# Patient Record
Sex: Female | Born: 1980 | Race: Black or African American | Hispanic: No | Smoking: Current every day smoker
Health system: Southern US, Community
[De-identification: ages and names within clinical notes are randomized; demographics above are authoritative.]

## PROBLEM LIST (undated history)

## (undated) DIAGNOSIS — E876 Hypokalemia: Secondary | ICD-10-CM

## (undated) HISTORY — PX: TUBAL LIGATION: SHX77

---
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
## 2004-04-13 ENCOUNTER — Emergency Department: Payer: Self-pay | Admitting: Emergency Medicine

## 2004-07-16 ENCOUNTER — Emergency Department: Payer: Self-pay | Admitting: Internal Medicine

## 2004-07-17 ENCOUNTER — Ambulatory Visit: Payer: Self-pay | Admitting: Internal Medicine

## 2005-10-31 ENCOUNTER — Emergency Department: Payer: Self-pay | Admitting: Emergency Medicine

## 2006-07-11 ENCOUNTER — Emergency Department: Payer: Self-pay | Admitting: Emergency Medicine

## 2006-10-31 ENCOUNTER — Inpatient Hospital Stay: Payer: Self-pay | Admitting: Internal Medicine

## 2007-10-31 ENCOUNTER — Emergency Department: Payer: Self-pay | Admitting: Emergency Medicine

## 2007-12-09 ENCOUNTER — Emergency Department: Payer: Self-pay | Admitting: Emergency Medicine

## 2008-07-01 ENCOUNTER — Emergency Department: Payer: Self-pay | Admitting: Internal Medicine

## 2008-10-04 ENCOUNTER — Emergency Department: Payer: Self-pay | Admitting: Emergency Medicine

## 2008-12-03 ENCOUNTER — Emergency Department: Payer: Self-pay | Admitting: Emergency Medicine

## 2009-05-03 ENCOUNTER — Observation Stay: Payer: Self-pay | Admitting: Obstetrics and Gynecology

## 2009-06-13 ENCOUNTER — Inpatient Hospital Stay: Payer: Self-pay | Admitting: Obstetrics and Gynecology

## 2010-04-12 ENCOUNTER — Inpatient Hospital Stay: Payer: Self-pay | Admitting: Internal Medicine

## 2010-12-11 ENCOUNTER — Emergency Department: Payer: Self-pay | Admitting: Emergency Medicine

## 2010-12-12 ENCOUNTER — Inpatient Hospital Stay (HOSPITAL_COMMUNITY)
Admission: EM | Admit: 2010-12-12 | Discharge: 2010-12-17 | DRG: 392 | Disposition: A | Discharge status: Home / Self Care | Payer: Medicaid Other | Attending: Internal Medicine | Admitting: Internal Medicine

## 2010-12-12 ENCOUNTER — Emergency Department: Payer: Self-pay | Admitting: Emergency Medicine

## 2010-12-12 DIAGNOSIS — R112 Nausea with vomiting, unspecified: Secondary | ICD-10-CM | POA: Diagnosis present

## 2010-12-12 DIAGNOSIS — R1115 Cyclical vomiting syndrome unrelated to migraine: Secondary | ICD-10-CM

## 2010-12-12 DIAGNOSIS — E860 Dehydration: Secondary | ICD-10-CM

## 2010-12-12 DIAGNOSIS — E876 Hypokalemia: Secondary | ICD-10-CM | POA: Diagnosis present

## 2010-12-12 DIAGNOSIS — Z79899 Other long term (current) drug therapy: Secondary | ICD-10-CM

## 2010-12-12 DIAGNOSIS — R109 Unspecified abdominal pain: Principal | ICD-10-CM | POA: Diagnosis present

## 2010-12-12 DIAGNOSIS — K5900 Constipation, unspecified: Secondary | ICD-10-CM | POA: Diagnosis present

## 2010-12-12 DIAGNOSIS — F17200 Nicotine dependence, unspecified, uncomplicated: Secondary | ICD-10-CM | POA: Diagnosis present

## 2010-12-12 LAB — CBC
Hemoglobin: 13.7 g/dL (ref 12.0–15.0)
Platelets: 193 10*3/uL (ref 150–400)
RBC: 4.42 MIL/uL (ref 3.87–5.11)
WBC: 6.8 10*3/uL (ref 4.0–10.5)

## 2010-12-12 LAB — URINALYSIS, ROUTINE W REFLEX MICROSCOPIC
Glucose, UA: NEGATIVE mg/dL
Ketones, ur: 40 mg/dL — AB
Leukocytes, UA: NEGATIVE
Protein, ur: 100 mg/dL — AB
pH: 7 (ref 5.0–8.0)

## 2010-12-12 LAB — COMPREHENSIVE METABOLIC PANEL
ALT: 12 U/L (ref 0–35)
Albumin: 4 g/dL (ref 3.5–5.2)
Alkaline Phosphatase: 40 U/L (ref 39–117)
BUN: 13 mg/dL (ref 6–23)
Chloride: 100 mEq/L (ref 96–112)
GFR calc Af Amer: >90 mL/min (ref >90)
Glucose, Bld: 94 mg/dL (ref 70–99)
Potassium: 2.9 mEq/L — ABNORMAL LOW (ref 3.5–5.1)
Sodium: 141 mEq/L (ref 135–145)
Total Bilirubin: 0.3 mg/dL (ref 0.3–1.2)

## 2010-12-12 LAB — DIFFERENTIAL
Lymphs Abs: 0.9 10*3/uL (ref 0.7–4.0)
Monocytes Relative: 11 % (ref 3–12)
Neutro Abs: 5.2 10*3/uL (ref 1.7–7.7)
Neutrophils Relative %: 77 % (ref 43–77)

## 2010-12-12 LAB — URINE MICROSCOPIC-ADD ON

## 2010-12-12 MED ORDER — METOCLOPRAMIDE HCL 5 MG/ML IJ SOLN
10.0000 mg | Freq: Once | INTRAMUSCULAR | Status: AC
  Administered 2010-12-12: 10 mg via INTRAVENOUS
  Filled 2010-12-12: qty 2

## 2010-12-12 MED ORDER — POTASSIUM CHLORIDE 10 MEQ/100ML IV SOLN
10.0000 meq | Freq: Once | INTRAVENOUS | Status: AC
  Administered 2010-12-12: 10 meq via INTRAVENOUS
  Filled 2010-12-12: qty 100

## 2010-12-12 MED ORDER — SODIUM CHLORIDE 0.9 % IV BOLUS (SEPSIS)
1000.0000 mL | Freq: Once | INTRAVENOUS | Status: AC
  Administered 2010-12-12: 1000 mL via INTRAVENOUS

## 2010-12-12 MED ORDER — MORPHINE SULFATE 2 MG/ML IJ SOLN
2.0000 mg | INTRAMUSCULAR | Status: DC | PRN
  Administered 2010-12-12: 2 mg via INTRAVENOUS
  Filled 2010-12-12: qty 1

## 2010-12-12 MED ORDER — ONDANSETRON HCL 4 MG/2ML IJ SOLN
4.0000 mg | Freq: Once | INTRAMUSCULAR | Status: AC
  Administered 2010-12-12: 4 mg via INTRAVENOUS
  Filled 2010-12-12: qty 2

## 2010-12-12 MED ORDER — ONDANSETRON HCL 4 MG/2ML IJ SOLN
4.0000 mg | Freq: Four times a day (QID) | INTRAMUSCULAR | Status: DC | PRN
  Administered 2010-12-12 – 2010-12-13 (×3): 4 mg via INTRAVENOUS
  Filled 2010-12-12 (×3): qty 2

## 2010-12-12 MED ORDER — MORPHINE SULFATE 4 MG/ML IJ SOLN
INTRAMUSCULAR | Status: AC
  Administered 2010-12-12: 2 mg
  Filled 2010-12-12: qty 1

## 2010-12-12 MED ORDER — MORPHINE SULFATE 4 MG/ML IJ SOLN
4.0000 mg | Freq: Once | INTRAMUSCULAR | Status: AC
  Administered 2010-12-12: 4 mg via INTRAVENOUS
  Filled 2010-12-12: qty 1

## 2010-12-12 MED ORDER — SODIUM CHLORIDE 0.9 % IV SOLN
INTRAVENOUS | Status: DC
  Administered 2010-12-12: 18:00:00 via INTRAVENOUS
  Administered 2010-12-13: 1000 mL via INTRAVENOUS
  Administered 2010-12-13: 08:00:00 via INTRAVENOUS

## 2010-12-12 MED ORDER — SODIUM CHLORIDE 0.9 % IV SOLN: Freq: Once | INTRAVENOUS | Status: DC

## 2010-12-12 NOTE — ED Notes (Signed)
Provider aware of ineffectiveness of prn doses of zofran and morphine

## 2010-12-12 NOTE — ED Provider Notes (Signed)
History     CSN: [id number] Arrival date & time: 12/12/2010  1:31 PM   First MD Initiated Contact with Patient 12/12/10 1347      Chief Complaint  Patient presents with  . Abdominal Pain    (Consider location/radiation/quality/duration/timing/severity/associated sxs/prior treatment) Patient is a 30 y.o. female presenting with abdominal pain. The history is provided by the patient.  Abdominal Pain The primary symptoms of the illness include abdominal pain, nausea, vomiting and diarrhea. The primary symptoms of the illness do not include fever, dysuria, vaginal discharge or vaginal bleeding. The current episode started more than 2 days ago. The onset of the illness was sudden. The problem has not changed since onset. The abdominal pain is generalized (started in the right pelvic region but has now spread to the entire abd.). The severity of the abdominal pain is 6/10. The abdominal pain is relieved by nothing. The abdominal pain is exacerbated by vomiting and eating.  Vomiting occurs more than 10 times per day. The emesis contains stomach contents.  The patient states that she believes she is currently not pregnant. Symptoms associated with the illness do not include constipation.    History reviewed. No pertinent past medical history.  History reviewed. No pertinent past surgical history.  No family history on file.  History  Substance Use Topics  . Smoking status: Not on file  . Smokeless tobacco: Not on file  . Alcohol Use: Not on file    OB History    Grav Para Term Preterm Abortions TAB SAB Ect Mult Living                  Review of Systems  Constitutional: Negative for fever.  Gastrointestinal: Positive for nausea, vomiting, abdominal pain and diarrhea. Negative for constipation.  Genitourinary: Negative for dysuria, vaginal bleeding and vaginal discharge.  All other systems reviewed and are negative.    Allergies  Review of patient's allergies indicates no  known allergies.  Home Medications   Current Outpatient Rx  Name Route Sig Dispense Refill  . HYDROCODONE-ACETAMINOPHEN 5-325 MG PO TABS Oral Take 1 tablet by mouth every 6 (six) hours as needed. FOR PAIN    . ONDANSETRON HCL 4 MG PO TABS Oral Take 4 mg by mouth every 6 (six) hours as needed. FOR NAUSEA & VOMITING       Pulse 62  Temp(Src) 98.4 F (36.9 C) (Oral)  Resp 22  Ht 5\' 11"  (1.803 m)  Wt 180 lb (81.647 kg)  BMI 25.10 kg/m2  SpO2 100%  LMP 11/10/2010  Physical Exam  Nursing note and vitals reviewed. Constitutional: She is oriented to person, place, and time. She appears well-developed and well-nourished. She appears distressed.  HENT:  Head: Normocephalic and atraumatic.  Mouth/Throat: Mucous membranes are dry.  Eyes: EOM are normal. Pupils are equal, round, and reactive to light.  Cardiovascular: Normal rate, regular rhythm, normal heart sounds and intact distal pulses.  Exam reveals no friction rub.   No murmur heard. Pulmonary/Chest: Effort normal and breath sounds normal. She has no wheezes. She has no rales.  Abdominal: Soft. Bowel sounds are normal. She exhibits no distension. There is generalized tenderness. There is no rebound, no guarding and no CVA tenderness.  Musculoskeletal: Normal range of motion. She exhibits no tenderness.       No edema  Neurological: She is alert and oriented to person, place, and time. No cranial nerve deficit.  Skin: Skin is warm and dry. No rash noted.  Psychiatric: She  has a normal mood and affect. Her behavior is normal.    ED Course  Procedures (including critical care time)   Labs Reviewed  CBC  DIFFERENTIAL  COMPREHENSIVE METABOLIC PANEL  URINALYSIS, ROUTINE W REFLEX MICROSCOPIC   No results found.   No diagnosis found.    MDM   Pt with persistent vomiting since tues and some mild diarrhea which is now resolved but mild diffuse abd pain due to all the vomitting but no peritoneal signs.  Pt was seen at [facility]  last night and given fluids and antiemetic and states had neg pregnancy, and neg urine and blood tests.  States had an U/S of her entire abd which was normal.  States sometimes this happens before her period. Will get CBC, CMP and will hydrate and give antiemetic.  Will place in obs.      Gwyneth Sprout, MD 12/12/10 1958

## 2010-12-12 NOTE — ED Notes (Signed)
Pt stating now pain and nausea subsided.

## 2010-12-12 NOTE — ED Notes (Signed)
Denies nausea, ice chips given

## 2010-12-12 NOTE — ED Notes (Signed)
Pt presents with 3 day h/o lower abdominal pain that is now generalized with nausea, vomiting and diarrhea.  Pt seen at [facility], received IV fluids with blood and urine tests negative; ultrasound: negative.  Pt reports she was supposed to start her period on the 14th, (pregnancy test: negative).  Pt reports pain has worsened, unable to keep anything down (eating ice chips, then vomiting in triage).

## 2010-12-12 NOTE — ED Notes (Signed)
Pt to cdu on dehydration protocol. States lower quad abd pain since Tuesday that has now become generalized. Also assoc with n/v/d. Pt was seen at [facility] today and Tuesday. States screening there was negative.

## 2010-12-12 NOTE — ED Notes (Signed)
Attempt two IV attempts paged and spoke with IV team will attempt IV.

## 2010-12-12 NOTE — ED Notes (Signed)
Pt given ginger ale. Pt drink entire can of such, pt now reports pain and nausea.

## 2010-12-12 NOTE — ED Notes (Signed)
Pt vomited after ice chips given. Reports increased pain.

## 2010-12-13 ENCOUNTER — Encounter (HOSPITAL_COMMUNITY): Payer: Self-pay | Admitting: General Practice

## 2010-12-13 ENCOUNTER — Emergency Department (HOSPITAL_COMMUNITY): Payer: Medicaid Other

## 2010-12-13 LAB — COMPREHENSIVE METABOLIC PANEL
ALT: 10 U/L (ref 0–35)
AST: 16 U/L (ref 0–37)
Albumin: 3.5 g/dL (ref 3.5–5.2)
Alkaline Phosphatase: 38 U/L — ABNORMAL LOW (ref 39–117)
Glucose, Bld: 87 mg/dL (ref 70–99)
Potassium: 3 mEq/L — ABNORMAL LOW (ref 3.5–5.1)
Sodium: 139 mEq/L (ref 135–145)
Total Protein: 6.9 g/dL (ref 6.0–8.3)

## 2010-12-13 LAB — CBC
HCT: 38 % (ref 36.0–46.0)
MCH: 30.5 pg (ref 26.0–34.0)
MCV: 89.8 fL (ref 78.0–100.0)
Platelets: 187 10*3/uL (ref 150–400)
RBC: 4.23 MIL/uL (ref 3.87–5.11)

## 2010-12-13 MED ORDER — POTASSIUM CHLORIDE 10 MEQ/100ML IV SOLN
10.0000 meq | INTRAVENOUS | Status: AC
  Administered 2010-12-13 (×5): 10 meq via INTRAVENOUS
  Filled 2010-12-13 (×5): qty 100

## 2010-12-13 MED ORDER — PANTOPRAZOLE SODIUM 40 MG IV SOLR
40.0000 mg | INTRAVENOUS | Status: DC
  Administered 2010-12-13 – 2010-12-16 (×4): 40 mg via INTRAVENOUS
  Filled 2010-12-13 (×5): qty 40

## 2010-12-13 MED ORDER — PROMETHAZINE HCL 25 MG/ML IJ SOLN
12.5000 mg | INTRAMUSCULAR | Status: AC
  Filled 2010-12-13: qty 1

## 2010-12-13 MED ORDER — METOCLOPRAMIDE HCL 5 MG/ML IJ SOLN
10.0000 mg | Freq: Once | INTRAMUSCULAR | Status: AC
  Administered 2010-12-13: 10 mg via INTRAVENOUS
  Filled 2010-12-13: qty 2

## 2010-12-13 MED ORDER — MORPHINE SULFATE 2 MG/ML IJ SOLN: 1.0000 mg | INTRAMUSCULAR | Status: DC | PRN

## 2010-12-13 MED ORDER — SODIUM CHLORIDE 0.9 % IV SOLN
INTRAVENOUS | Status: DC
  Administered 2010-12-13 – 2010-12-16 (×6): via INTRAVENOUS
  Administered 2010-12-16: 150 mL/h via INTRAVENOUS
  Administered 2010-12-16: 01:00:00 via INTRAVENOUS

## 2010-12-13 MED ORDER — LORAZEPAM 2 MG/ML IJ SOLN
1.0000 mg | Freq: Once | INTRAMUSCULAR | Status: AC
  Administered 2010-12-13: 1 mg via INTRAVENOUS
  Filled 2010-12-13: qty 1

## 2010-12-13 MED ORDER — PROMETHAZINE HCL 25 MG/ML IJ SOLN
12.5000 mg | Freq: Four times a day (QID) | INTRAMUSCULAR | Status: DC | PRN
  Administered 2010-12-13 (×2): 12.5 mg via INTRAVENOUS
  Filled 2010-12-13 (×2): qty 1

## 2010-12-13 MED ORDER — FLEET ENEMA 7-19 GM/118ML RE ENEM
1.0000 | ENEMA | Freq: Every day | RECTAL | Status: DC | PRN
  Filled 2010-12-13: qty 1

## 2010-12-13 MED ORDER — ENOXAPARIN SODIUM 40 MG/0.4ML ~~LOC~~ SOLN
40.0000 mg | SUBCUTANEOUS | Status: DC
  Administered 2010-12-13 – 2010-12-16 (×4): 40 mg via SUBCUTANEOUS
  Filled 2010-12-13 (×5): qty 0.4

## 2010-12-13 MED ORDER — PROMETHAZINE HCL 25 MG PO TABS
12.5000 mg | ORAL_TABLET | Freq: Four times a day (QID) | ORAL | Status: DC | PRN
  Filled 2010-12-13: qty 1

## 2010-12-13 NOTE — ED Notes (Signed)
Clear liquid breakfast ordered

## 2010-12-13 NOTE — ED Provider Notes (Signed)
Medical screening examination/treatment/procedure(s) were performed by non-physician practitioner and as supervising physician I was immediately available for consultation/collaboration.   Laray Anger, DO 12/13/10 [phone]

## 2010-12-13 NOTE — H&P (Signed)
Hospital Admission Note Date: 12/13/2010  Patient name: Erin Pitts Medical record number: [id number] Date of birth: 08-24-1980 Age: 30 y.o. Gender: female PCP: No primary provider on file.  Medical Service: Internal medicine teaching service - Maurice March  Attending physician:  Dr. Meredith Pel    1st Contact: Dr. Richarda Blade    Pager: [phone] 2nd Contact: Dr. Tonny Branch    Pager: [phone] After 5 pm or weekends: 1st Contact:    Pager: [phone] 2nd Contact:    Pager: [phone]  Chief Complaint: nausea, vomiting, lower abdominal discomfort  History of Present Illness:  Patient is a 30 year old female with no known past medical history who presents with 4 days of vomiting and lower abdominal pain. She states she initially began to experience some lower abdominal pain that was intermittent and cramping in nature and very similar to her monthly menstrual cramps. Approximately 4 days prior to arrival she began to vomit nonbilious, nonbloody emesis. She was initially evaluated at [facility] on November 16 for these complaints; lab work obtained at that time was unrevealing as was abdominal ultrasound.  She states her symptoms have progressed following her ER visit at [facility] and she is now unable to tolerate anything by mouth including small sips of fluid. She notes all of these symptoms are similar to those she has experienced before with menstruation and she notes she was due for her menstrual cycle on November 14; this has not yet started. She admits to shaking chills for one day but denies fevers or sweats. She denies dysuria or hematuria. She denies diarrhea, bright blood per rectum, or dark black bowel movements. She notes she has not had a bowel movement for 7 days.  This is abnormal for her as she typically has one bowel movement daily.  She denies chest pain, shortness of breath, sick contacts, or any other complaint.    Meds: Patient denies taking any prescription medications, over-the-counter  medications, or herbal supplements on a daily basis  Allergies: NKDA  Past medical history: patient denies significant past medical history  Family history: Mother: Hypertension Grandfather: Diabetes mellitus Other: Hypertension  Social history: The patient lives alone in [place]. She currently works at the [employer] and also attends classes. She is a current smoker and has smoked 1/4 to one half a pack per day for 3 years. She denies alcohol use. She admits to smoking marijuana one to 2 times per week. She denies any other illicit drug use the  Review of Systems: Pertinent items are noted in HPI.  Physical Exam: Blood pressure 151/89, pulse 53, temperature 99.6 F (37.6 C), temperature source Oral, resp. rate 18, height 5\' 11"  (1.803 m), weight 188 lb 11.2 oz (85.594 kg), last menstrual period 11/10/2010, SpO2 100.00%.  GEN: Patient initially asleep but easily arousable. She appears ill and vomits minimal amounts of clear fluid during the exam.  Alert and oriented x 3.  Pleasant, conversant, and cooperative to exam. HEENT: head is autraumatic and normocephalic.  Neck is supple without palpable masses or lymphadenopathy.  No JVD or carotid bruits.  Vision intact.  EOMI.  PERRLA.  Sclerae anicteric.  Conjunctivae without pallor or injection. Mucous membranes are dry.  Oropharynx is without erythema, exudates, or other abnormal lesions.  RESP:  Lungs are clear to ascultation bilaterally with good air movement.  No wheezes, ronchi, or rubs. CARDIOVASCULAR: regular rate, normal rhythm.  Clear S1, S2, no murmurs, gallops, or rubs. ABDOMEN: soft, a few slightly tender to palpation with voluntary guarding;  tendnerness is greatest in the epigastrium, abdomen is non-distended.  Bowels sounds present in all quadrants and hypoactive.  No palpable masses. EXT: warm and dry.  Peripheral pulses equal, intact, and +2 globally.  No clubbing or cyanosis.  No edema in bilateral lower  extremities. SKIN: warm and dry with normal turgor.  No rashes or abnormal lesions observed. NEURO: CN II-XII grossly intact.  Muscle strength +5/5 in bilateral upper and lower extremities.  Sensation is grossly intact.  No focal deficit.   Lab results: Basic Metabolic Panel:  Basename 12/13/10 1248 12/12/10 1409  NA 139 141  K 3.0* 2.9*  CL 105 100  CO2 26 27  GLUCOSE 87 94  BUN 10 13  CREATININE 0.59 0.58  CALCIUM 8.7 9.2  MG -- --  PHOS -- --   Liver Function Tests:  Basename 12/13/10 1248 12/12/10 1409  AST 16 18  ALT 10 12  ALKPHOS 38* 40  BILITOT 0.3 0.3  PROT 6.9 7.6  ALBUMIN 3.5 4.0   CBC:  Basename 12/13/10 1248 12/12/10 1409  WBC 7.7 6.8  NEUTROABS -- 5.2  HGB 12.9 13.7  HCT 38.0 39.5  MCV 89.8 89.4  PLT 187 193    Assessment & Plan by Problem: # Intractable nausea and vomiting:  The etiology of her profuse nausea and vomiting is unclear at this time.  It is possible the symptoms are related to her menstrual cycle she has had similar symptoms in the past; urine pregnancy was negative.  Records from her visit to the [facility] ER were obtained are are unrevealing.  At this point the differential is fairly broad and includes gastroenteritis (though this seems less likely as she is afebrile and not experiencing any diarrhea), pancreatitis, acute hepatitis infection, SBO/ileus, severe gastritis, urinary tract infection (this also seems less likely given lack of urinary symptoms), PID, etc.  Gallbladder dysfunction seems unlikely given location of abdominal discomfort and normal findings on recent U/S. - Admit to regular bed - NPO - Check lipase - Obtain LFTS - Check acute hepatitis panel - Check UDS - Send urine for gonorrhea and chlamydia probe - Phenergan for symptomatic relief - Will obtain acute abdominal series to evaluate for her small bowel obstruction or ileus  #Hypokalemia:This is very likely the result of her significant vomiting. - Will  replete with IV KCl is unable to tolerate anything by mouth - Check magnesium level - Repeat BMET  #Abdominal discomfort: unclear etiology.  Her lower abdominal discomfort may represent menstrual cramps.  The epigastrium tenderness may reflect GERD/gastritis, pancreatitis, or be the result of irritation 2/2 to her excessive vomiting.  There is no evidence to suggest she has suffered an esophageal tear/rupture.  Will continue to monitor. - Morphine 1-2 mg every 4 hours prn pain.  Will transition to an oral pain medication when she is able to tolerate a diet - Protonic for empiric treatment of possible gastritis/GERD  #Constipation: Patient reports a 7 day history of constipation. She is unable to tolerate anything by mouth we'll administer fleets enema to promote a bowel movement. - Fleets enema x 1 - Acute abdominal series as outlined above  #DVT prophylaxis: lovenox  Signed: Sussie Minor 12/13/2010, 5:56 PM

## 2010-12-13 NOTE — ED Notes (Signed)
Pt medicated for vomiting after trying to drink some chicken broth

## 2010-12-13 NOTE — ED Provider Notes (Signed)
History     CSN: [id number] Arrival date & time: 12/12/2010  1:31 PM   First MD Initiated Contact with Patient 12/12/10 1347      Chief Complaint  Patient presents with  . Abdominal Pain    (Consider location/radiation/quality/duration/timing/severity/associated sxs/prior treatment) HPI  History reviewed. No pertinent past medical history.  History reviewed. No pertinent past surgical history.  No family history on file.  History  Substance Use Topics  . Smoking status: Not on file  . Smokeless tobacco: Not on file  . Alcohol Use: Not on file    OB History    Grav Para Term Preterm Abortions TAB SAB Ect Mult Living                  Review of Systems  Allergies  Review of patient's allergies indicates no known allergies.  Home Medications   Current Outpatient Rx  Name Route Sig Dispense Refill  . HYDROCODONE-ACETAMINOPHEN 5-325 MG PO TABS Oral Take 1 tablet by mouth every 6 (six) hours as needed. FOR PAIN    . ONDANSETRON HCL 4 MG PO TABS Oral Take 4 mg by mouth every 6 (six) hours as needed. FOR NAUSEA & VOMITING       BP 123/71  Pulse 56  Temp(Src) 98.9 F (37.2 C) (Oral)  Resp 16  Ht 5\' 11"  (1.803 m)  Wt 180 lb (81.647 kg)  BMI 25.10 kg/m2  SpO2 97%  LMP 11/10/2010  Physical Exam  ED Course  Procedures (including critical care time)  Labs Reviewed  COMPREHENSIVE METABOLIC PANEL - Abnormal; Notable for the following:    Potassium 2.9 (*)    All other components within normal limits  URINALYSIS, ROUTINE W REFLEX MICROSCOPIC - Abnormal; Notable for the following:    Color, Urine AMBER (*) BIOCHEMICALS MAY BE AFFECTED BY COLOR   Appearance HAZY (*)    Specific Gravity, Urine 1.036 (*)    Bilirubin Urine SMALL (*)    Ketones, ur 40 (*)    Protein, ur 100 (*)    All other components within normal limits  URINE MICROSCOPIC-ADD ON - Abnormal; Notable for the following:    Squamous Epithelial / LPF MANY (*)    Bacteria, UA MANY (*)    All other  components within normal limits  CBC  DIFFERENTIAL  PREGNANCY, URINE   No results found.   1. Persistent vomiting   2. Dehydration    8:09 AM Patient seen and reexamined this morning. She states that overnight she has been improved and sipping on ginger ale. However when we gave the patient a clear liquid breakfast, she started to vomit again right after eating. Will continue to monitor, re\re dose pain and nausea medication. Will reassess in several hours. If patient is not improved she may need admission to hospital for persistent vomiting and dehydration.  11:31 AM I've spoken with internal medicine teaching service who will admit the patient for dehydration and persistent vomiting.  MDM  Admit for persistent vomiting as patient has failed PO trial.        Carolee Rota, PA 12/13/10 1133

## 2010-12-13 NOTE — ED Notes (Signed)
Fax received from [facility] and info reviewed by pa.

## 2010-12-13 NOTE — ED Notes (Signed)
Report called to [facility] RN on [phone]; per receiving RN, there is no bed in room and RN will call back when clean bed is in room

## 2010-12-13 NOTE — ED Notes (Signed)
Pt medicated for vomiting. Pa aware and would like records obtained from [facility] to help facilitate pt admission to the hospital

## 2010-12-13 NOTE — ED Notes (Signed)
Pt up vomiting again. Will medicated

## 2010-12-13 NOTE — ED Notes (Signed)
Pt given ginger ale for oral trial 

## 2010-12-13 NOTE — ED Notes (Signed)
Attempted to call report to [phone].  RN unable to accept report at this time.

## 2010-12-14 ENCOUNTER — Inpatient Hospital Stay (HOSPITAL_COMMUNITY): Payer: Medicaid Other

## 2010-12-14 DIAGNOSIS — R112 Nausea with vomiting, unspecified: Secondary | ICD-10-CM | POA: Diagnosis present

## 2010-12-14 DIAGNOSIS — R109 Unspecified abdominal pain: Secondary | ICD-10-CM | POA: Diagnosis present

## 2010-12-14 LAB — RAPID URINE DRUG SCREEN, HOSP PERFORMED
Amphetamines: NOT DETECTED
Barbiturates: NOT DETECTED
Benzodiazepines: NOT DETECTED
Tetrahydrocannabinol: POSITIVE — AB

## 2010-12-14 LAB — URINALYSIS, ROUTINE W REFLEX MICROSCOPIC
Bilirubin Urine: NEGATIVE
Ketones, ur: 40 mg/dL — AB
Leukocytes, UA: NEGATIVE
Nitrite: NEGATIVE
Protein, ur: NEGATIVE mg/dL

## 2010-12-14 LAB — CBC
HCT: 38.6 % (ref 36.0–46.0)
Hemoglobin: 13.2 g/dL (ref 12.0–15.0)
MCH: 30.4 pg (ref 26.0–34.0)
MCV: 88.9 fL (ref 78.0–100.0)
RBC: 4.34 MIL/uL (ref 3.87–5.11)

## 2010-12-14 LAB — BASIC METABOLIC PANEL
BUN: 7 mg/dL (ref 6–23)
CO2: 26 mEq/L (ref 19–32)
CO2: 24 mEq/L (ref 19–32)
Calcium: 8.4 mg/dL (ref 8.4–10.5)
Chloride: 103 mEq/L (ref 96–112)
GFR calc non Af Amer: >90 mL/min (ref >90)
Glucose, Bld: 83 mg/dL (ref 70–99)
Glucose, Bld: 76 mg/dL (ref 70–99)
Potassium: 2.7 mEq/L — CL (ref 3.5–5.1)
Sodium: 138 mEq/L (ref 135–145)
Sodium: 138 mEq/L (ref 135–145)

## 2010-12-14 LAB — URINE CULTURE: Colony Count: 45000

## 2010-12-14 LAB — HIV ANTIBODY (ROUTINE TESTING W REFLEX): HIV: NONREACTIVE

## 2010-12-14 MED ORDER — POTASSIUM CHLORIDE 10 MEQ/100ML IV SOLN
10.0000 meq | INTRAVENOUS | Status: AC
  Administered 2010-12-14 – 2010-12-15 (×6): 10 meq via INTRAVENOUS
  Filled 2010-12-14 (×6): qty 100

## 2010-12-14 MED ORDER — ONDANSETRON HCL 4 MG/2ML IJ SOLN
4.0000 mg | Freq: Four times a day (QID) | INTRAMUSCULAR | Status: DC
  Administered 2010-12-14 – 2010-12-17 (×11): 4 mg via INTRAVENOUS
  Filled 2010-12-14 (×23): qty 2

## 2010-12-14 MED ORDER — PROMETHAZINE HCL 25 MG PO TABS: 12.5000 mg | ORAL_TABLET | ORAL | Status: DC | PRN

## 2010-12-14 MED ORDER — PROMETHAZINE HCL 25 MG/ML IJ SOLN
12.5000 mg | INTRAMUSCULAR | Status: DC | PRN
  Administered 2010-12-14 – 2010-12-16 (×3): 12.5 mg via INTRAVENOUS
  Filled 2010-12-14 (×3): qty 1

## 2010-12-14 MED ORDER — PROMETHAZINE HCL 25 MG/ML IJ SOLN
12.5000 mg | Freq: Four times a day (QID) | INTRAMUSCULAR | Status: DC | PRN
  Filled 2010-12-14: qty 1

## 2010-12-14 MED ORDER — POTASSIUM CHLORIDE 10 MEQ/100ML IV SOLN
10.0000 meq | INTRAVENOUS | Status: AC
  Administered 2010-12-14 (×5): 10 meq via INTRAVENOUS
  Filled 2010-12-14 (×6): qty 100

## 2010-12-14 MED ORDER — POTASSIUM CHLORIDE 10 MEQ/100ML IV SOLN: 10.0000 meq | Freq: Once | INTRAVENOUS | Status: DC

## 2010-12-14 MED ORDER — PROMETHAZINE HCL 25 MG/ML IJ SOLN
12.5000 mg | INTRAMUSCULAR | Status: DC | PRN
  Administered 2010-12-14 (×3): 12.5 mg via INTRAVENOUS
  Filled 2010-12-14 (×2): qty 1

## 2010-12-14 NOTE — Progress Notes (Signed)
Subjective: [name] states that she is still feeling very nauseated. She also has diffuse abdominal pain that has not improved. The pain is worse with motion. She is vomiting green watery fluid every hour. She states she is very tired. She denies any shortness of breath.  She states she has not had a bowel movement since Monday, nor has she had any flatus. She states the abdominal pain began on Tuesday.   Objective: Vital signs in last 24 hours: Filed Vitals:   12/13/10 1727 12/13/10 2100 12/14/10 0608 12/14/10 1356  BP: 151/89 143/89  143/87  Pulse: 53 59 67 66  Temp: 99.6 F (37.6 C) 98.7 F (37.1 C) 98.9 F (37.2 C) 98.7 F (37.1 C)  TempSrc: Oral     Resp: 18 20 19 18   Height: 5\' 11"  (1.803 m)     Weight: 188 lb 11.2 oz (85.594 kg)     SpO2: 100% 100% 99% 99%   Weight change: 8 lb 11.2 oz (3.946 kg)  Intake/Output Summary (Last 24 hours) at 12/14/10 1650 Last data filed at 12/14/10 0753  Gross per 24 hour  Intake  972.5 ml  Output      2 ml  Net  970.5 ml   Physical Exam:  General: resting in bed in a dark lying very still. Cardiac: RRR, no rubs, murmurs or gallops Pulm: clear to auscultation bilaterally, taking shallow breaths secondary to abdominal pain Abd: Almost absent bowel sounds. soft, nondistended. The abdomen is very tender to palpation. Ext: warm and well perfused, no pedal edema Neuro: alert and oriented X3, cranial nerves II-XII grossly intact, no focal deficits.  Lab Results: Basic Metabolic Panel:  Lab 12/14/10 1610 12/14/10 0500 12/13/10 1913  NA 138 138 --  K 3.4* 2.7* --  CL 103 103 --  CO2 24 26 --  GLUCOSE 76 83 --  BUN 7 8 --  CREATININE 0.60 0.63 --  CALCIUM 8.4 8.6 --  MG -- -- 1.8  PHOS -- -- --   Liver Function Tests:  Lab 12/13/10 1248 12/12/10 1409  AST 16 18  ALT 10 12  ALKPHOS 38* 40  BILITOT 0.3 0.3  PROT 6.9 7.6  ALBUMIN 3.5 4.0    Lab 12/13/10 1913  LIPASE 15  AMYLASE --   CBC:  Lab 12/14/10 0500 12/13/10  1248 12/12/10 1409  WBC 5.3 7.7 --  NEUTROABS -- -- 5.2  HGB 13.2 12.9 --  HCT 38.6 38.0 --  MCV 88.9 89.8 --  PLT 172 187 --   Micro Results: Urine culture was not a clean sample.  Studies/Results: Acute Abdominal Series  12/13/2010  *RADIOLOGY REPORT*  Clinical Data: Abdominal pain.  Nausea and vomiting.  Constipation.  ACUTE ABDOMEN SERIES (ABDOMEN 2 VIEW & CHEST 1 VIEW) 12/13/2010:  Comparison: None.  Findings: Bowel gas pattern unremarkable without evidence of obstruction or significant ileus.  No evidence of free intraperitoneal air or significant air fluid levels on the erect image.  No abnormal calcifications.  Visible psoas margins. Regional skeleton unremarkable.  Cardiomediastinal silhouette unremarkable.  Lungs clear.  No pleural effusions.  IMPRESSION: No acute abdominal or pulmonary abnormality.  Original Report Authenticated By: Arnell Sieving, M.D.   Medications: I have reviewed the patient's current medications. Scheduled Meds:   . enoxaparin  40 mg Subcutaneous Q24H  . ondansetron (ZOFRAN) IV  4 mg Intravenous Q6H  . pantoprazole (PROTONIX) IV  40 mg Intravenous Q24H  . potassium chloride  10 mEq Intravenous Q1 Hr x  5  . potassium chloride  10 mEq Intravenous Q1 Hr x 6  . potassium chloride  10 mEq Intravenous Q1 Hr x 6  . promethazine  12.5 mg Intravenous To Minor  . DISCONTD: potassium chloride  10 mEq Intravenous Once   Continuous Infusions:   . sodium chloride 150 mL/hr at 12/14/10 1246  . DISCONTD: sodium chloride 1,000 mL (12/13/10 1410)   PRN Meds:.morphine, promethazine, sodium phosphate, DISCONTD: morphine, DISCONTD: ondansetron (ZOFRAN) IV, DISCONTD: promethazine, DISCONTD: promethazine, DISCONTD: promethazine, DISCONTD: promethazine, DISCONTD: promethazine Assessment/Plan: #1 Nausea and vomiting - the cause of Erin Pitts's nausea and vomiting is unclear at this time. It's potentially secondary to atypical menstrual symptoms, gastroenteritis , or  potentially ileus or obstruction. Her nausea and vomiting have not improved with Phenergan and making her n.p.o. We will change her Phenergan to Zofran for better control. And repeat abdominal series to rule out SBO or ileus. -- Acute abdominal series -- We will consider putting NG tube for decompression depending on the results of the abdominal series. -- Ondansetron 4 mg every 6 hours when necessary for nausea -- Pantoprazole  #2 abdominal pain -  [name] is currently written for morphine 1-2 mg IV every 4 hours as needed for abdominal pain.  #3 hypokalemia - [name] is severely hypokalemic. We will supplement this idea she is not taking any oral medications. Will give 6 runs of potassium and recheck BMP at 11 PM  DVT prophylaxis-Lovenox   LOS: 2 days  Nausea and vomiting Erin Pitts 12/14/2010, 4:50 PM

## 2010-12-14 NOTE — H&P (Signed)
Internal Medicine Attending Admission Note Date: 12/14/2010  Patient name: Erin Pitts Medical record number: [id number] Date of birth: 19-Sep-1980 Age: 30 y.o. Gender: female  I saw and evaluated the patient. I reviewed the resident's note and I agree with the resident's findings and plan as documented in the resident's note.  Chief Complaint(s):nausea, vomiting, lower abdominal pain for 4 days  History - key components related to admission:Erin Pitts is a 30 year old woman who presents with a four-day history of nausea, vomiting and lower abdominal pain.she states that her lower abdominal pain is crampy in nature and similar to her menstrual cramps. Her vomitus is nonbilious and nonbloody. On November 16 she presented to the [facility] emergency department for similar complaints. Her laboratory work was unremarkable as was an ultrasound. Her symptoms persisted and she returned to the emergency department at [facility]. She was there for 7 hours became frustrated with her weight and presented to the [facility]. She notes some chills but denies fevers, shakes, recent head trauma, sick contacts, eating out, or eating any food that did not taste right. She denies any diarrhea or bright red blood per rectum. In fact she has not hd a bowel movement in 7 days which is unusual for her. She also states that she has not passed any gas for the last 3 days. She denies a history of surgery of the abdomen, hernias, or abnormal lumps on the abdomen or in the groin. She was admitted for further evaluation and therapy for her nausea.  Physical Exam - key components related to admission:  Filed Vitals:   12/13/10 1727 12/13/10 2100 12/14/10 0608 12/14/10 1356  BP: 151/89 143/89  143/87  Pulse: 53 59 67 66  Temp: 99.6 F (37.6 C) 98.7 F (37.1 C) 98.9 F (37.2 C) 98.7 F (37.1 C)  TempSrc: Oral     Resp: 18 20 19 18   Height: 5\' 11"  (1.803 m)     Weight: 188 lb 11.2 oz (85.594 kg)      SpO2: 100% 100% 99% 99%   Gen.: Well-developed, well-nourished, young woman sitting in bed. Periodically vomiting during the interview. The vomit had a green tinge but there was no blood or coffee grounds observed. Lungs: Clear to auscultation and percussion bilaterally without wheezes, rhonchi or rales. Heart: Regular rate and rhythm without murmurs, rubs, or gallops. Abdomen: Soft, mildly tender in the bilateral lower quadrants, (L > R), very hypoactive bowel sounds, without surgical scars, masses, hepatosplenomegaly, or rebound. Inguinal area: No palpable masses, knots, or defects. Extremities: Without edema.  Lab results:   Basic Metabolic Panel:  Basename 12/14/10 1415 12/14/10 0500 12/13/10 1913  NA 138 138 --  K 3.4* 2.7* --  CL 103 103 --  CO2 24 26 --  GLUCOSE 76 83 --  BUN 7 8 --  CREATININE 0.60 0.63 --  CALCIUM 8.4 8.6 --  MG -- -- 1.8  PHOS -- -- --   Liver Function Tests:  Basename 12/13/10 1248 12/12/10 1409  AST 16 18  ALT 10 12  ALKPHOS 38* 40  BILITOT 0.3 0.3  PROT 6.9 7.6  ALBUMIN 3.5 4.0    Basename 12/13/10 1913  LIPASE 15  AMYLASE --   CBC:  Basename 12/14/10 0500 12/13/10 1248 12/12/10 1409  WBC 5.3 7.7 --  NEUTROABS -- -- 5.2  HGB 13.2 12.9 --  HCT 38.6 38.0 --  MCV 88.9 89.8 --  PLT 172 187 --    Imaging results:   Acute  abdominal series: Nonspecific bowel gas pattern, although a paucity of bowel gas in left upper quadrant. No free air. No calcifications identified. No air fluid levels.  Assessment & Plan by Problem:  Erin Pitts is a 30 year old woman who presents with nausea, vomiting, and lower abdominal pain for 4 days. The etiology of this pain is unclear.  The working diagnosis includes a gastritis, enteritis, food poisoning, or an atypical perimenstrual syndrome. No obstruction or ileus was seen on the abdominal x-ray. She has not responded to Phenergan or intravenous fluids.  What is most concerning is the lack of flatus.  Although she has no risk factors for obstruction this symptom is suggestive.  #1 nausea and vomiting: We will repeat the abdominal series to assess for ileus or evidence of obstruction. We will continue IV hydration. We will follow the potassium and magnesium levels. At this time we will repeat continue to replete the potassium. We will convert the Phenergan to Zofran in hopes of getting better symptomatic relief. We will also continue the PPI therapy empirically for gastritis. If the nausea and vomiting persist especially if she has an ileus or evidence of obstruction radiographically, we will consider placement of a nasogastric tube for symptomatic relief.  #2 hypokalemia: This is likely related to the vomiting. We will replete the potassium intravenously given her inability to tolerate any oral intake.

## 2010-12-14 NOTE — Progress Notes (Signed)
CRITICAL VALUE ALERT  Critical value received:  Potassium 2.7  Date of notification:  12/14/10  Time of notification:  0650  Critical value read back:yes  Nurse who received alert:  Verdene Lennert, RN  MD notified (1st page):  Dr. Clyde Lundborg  Time of first page:  0650  MD notified (2nd page):  Time of second page:  Responding MD:  Dr. Clyde Lundborg  Time MD responded:  [phone]

## 2010-12-15 DIAGNOSIS — R142 Eructation: Secondary | ICD-10-CM

## 2010-12-15 DIAGNOSIS — R109 Unspecified abdominal pain: Secondary | ICD-10-CM

## 2010-12-15 DIAGNOSIS — E876 Hypokalemia: Secondary | ICD-10-CM

## 2010-12-15 DIAGNOSIS — R143 Flatulence: Secondary | ICD-10-CM

## 2010-12-15 DIAGNOSIS — R141 Gas pain: Secondary | ICD-10-CM

## 2010-12-15 DIAGNOSIS — R112 Nausea with vomiting, unspecified: Secondary | ICD-10-CM

## 2010-12-15 LAB — BASIC METABOLIC PANEL
BUN: 9 mg/dL (ref 6–23)
GFR calc non Af Amer: >90 mL/min (ref >90)
Glucose, Bld: 71 mg/dL (ref 70–99)
Potassium: 4 mEq/L (ref 3.5–5.1)

## 2010-12-15 MED ORDER — KETOROLAC TROMETHAMINE 30 MG/ML IJ SOLN
30.0000 mg | Freq: Four times a day (QID) | INTRAMUSCULAR | Status: DC | PRN
  Administered 2010-12-15: 30 mg via INTRAVENOUS
  Filled 2010-12-15 (×2): qty 1

## 2010-12-15 NOTE — Progress Notes (Signed)
Subjective: Patient states she is feeling better today.  Still very tired but has not vomited since early this morning.  She states that she has been passing gas and is starting to feel hungry.  Pain is well controlled with the current medication.  Denies fever, chills, or bowel movement.  Objective: Vital signs in last 24 hours: Filed Vitals:   12/14/10 1356 12/14/10 2201 12/14/10 2202 12/15/10 0520  BP: 143/87 166/149 160/103 158/99  Pulse: 66 89  64  Temp: 98.7 F (37.1 C) 99.6 F (37.6 C)  99.2 F (37.3 C)  TempSrc:      Resp: 18 19  18   Height:      Weight:      SpO2: 99% 100%  98%   Weight change:   Intake/Output Summary (Last 24 hours) at 12/15/10 1345 Last data filed at 12/15/10 0229  Gross per 24 hour  Intake   2900 ml  Output      0 ml  Net   2900 ml   Physical Exam: Vitals reviewed. General: resting in bed, NAD HEENT: PERRL, EOMI, no scleral icterus Cardiac: RRR, no rubs, murmurs or gallops Pulm: clear to auscultation bilaterally, no wheezes, rales, or rhonchi Abd: soft, mild bilateral lower quadrant pain to palpation.  Mild epigastric tenderness to palpation, nondistended, BS present but slow. Ext: warm and well perfused, no pedal edema Neuro: alert and oriented X3, cranial nerves II-XII grossly intact, strength and sensation to light touch equal in bilateral upper and lower extremities  Lab Results: Basic Metabolic Panel:  Lab 12/15/10 1610 12/14/10 1415 12/13/10 1913  NA 136 138 --  K 4.0 3.4* --  CL 102 103 --  CO2 22 24 --  GLUCOSE 71 76 --  BUN 9 7 --  CREATININE 0.61 0.60 --  CALCIUM 8.9 8.4 --  MG -- -- 1.8  PHOS -- -- --   Liver Function Tests:  Lab 12/13/10 1248 12/12/10 1409  AST 16 18  ALT 10 12  ALKPHOS 38* 40  BILITOT 0.3 0.3  PROT 6.9 7.6  ALBUMIN 3.5 4.0    Lab 12/13/10 1913  LIPASE 15  AMYLASE --   CBC:  Lab 12/14/10 0500 12/13/10 1248 12/12/10 1409  WBC 5.3 7.7 --  NEUTROABS -- -- 5.2  HGB 13.2 12.9 --  HCT 38.6  38.0 --  MCV 88.9 89.8 --  PLT 172 187 --   Micro Results: Recent Results (from the past 240 hour(s))  URINE CULTURE     Status: Normal   Collection Time   12/13/10 12:57 PM      Component Value Range Status Comment   Specimen Description URINE, CATHETERIZED   Final    Special Requests NONE   Final    Setup Time [id number]   Final    Colony Count 45,000 COLONIES/ML   Final    Culture     Final    Value: Multiple bacterial morphotypes present, none predominant. Suggest appropriate recollection if clinically indicated.   Report Status 12/14/2010 FINAL   Final    Studies/Results: Dg Abd Acute W/chest  12/14/2010  *RADIOLOGY REPORT*  Clinical Data: Nausea/vomiting, evaluate for ileus or SBO  ACUTE ABDOMEN SERIES (ABDOMEN 2 VIEW & CHEST 1 VIEW)  Comparison: 12/13/2010  Findings: Lungs are clear. No pleural effusion or pneumothorax.  Cardiomediastinal silhouette is within normal limits.  Nonobstructive bowel gas pattern.  No evidence of free air under the diaphragm on the upright view.  Visualized osseous structures are within normal  limits.  IMPRESSION: No evidence of acute cardiopulmonary disease.  No evidence of small bowel obstruction or free air.  Original Report Authenticated By: Charline Bills, M.D.   Medications: I have reviewed the patient's current medications. Scheduled Meds:   . enoxaparin  40 mg Subcutaneous Q24H  . ondansetron (ZOFRAN) IV  4 mg Intravenous Q6H  . pantoprazole (PROTONIX) IV  40 mg Intravenous Q24H  . potassium chloride  10 mEq Intravenous Q1 Hr x 6  . DISCONTD: potassium chloride  10 mEq Intravenous Once   Continuous Infusions:   . sodium chloride 150 mL/hr at 12/15/10 0150   PRN Meds:.morphine, promethazine, promethazine, sodium phosphate, DISCONTD: promethazine Assessment/Plan: 1. Nausea and vomiting:  Unsure of the cause still currently.  Repeat abdominal x-ray was unconvincing for SBO.  Still potentially secondary to atypical menstrual symptoms,  gastroenteritis , or potentially ileus or obstruction.  Zofran is working better for control of her nausea and vomiting. With the report of flatus and decreased nausea we will attempt clear liquids.  -- Clear liquids tonight. -- Ondansetron 4 mg every 6 hours when necessary for nausea  -- Pantoprazole   2. abdominal pain:  Ms. Gerre Couch is currently written for morphine 1-2 mg IV every 4 hours as needed for abdominal pain. Abdominal pain is much improved.  Will continue to monitor.   3. Hypokalemia: Ms. Gerre Couch potassium is better after replacement.  We will continue to monitor.   4. DVT prophylaxis-Lovenox   LOS: 3 days   Reign Bartnick 12/15/2010, 1:45 PM

## 2010-12-15 NOTE — Progress Notes (Signed)
1800  Pt c/o of headache, states feels like sinus headache she has had before.  Morphine ordered for pain, will notify MD for medication for mild pain.

## 2010-12-15 NOTE — Progress Notes (Signed)
1630  Pt did not eat clear liquid dinner, states she drank ginger ale only and it made her sick, vomited small amt only.

## 2010-12-16 ENCOUNTER — Encounter (HOSPITAL_COMMUNITY): Payer: Self-pay | Admitting: Radiology

## 2010-12-16 ENCOUNTER — Inpatient Hospital Stay (HOSPITAL_COMMUNITY): Payer: Medicaid Other

## 2010-12-16 DIAGNOSIS — R109 Unspecified abdominal pain: Secondary | ICD-10-CM

## 2010-12-16 DIAGNOSIS — R112 Nausea with vomiting, unspecified: Secondary | ICD-10-CM

## 2010-12-16 DIAGNOSIS — E876 Hypokalemia: Secondary | ICD-10-CM

## 2010-12-16 LAB — HCG, QUANTITATIVE, PREGNANCY: hCG, Beta Chain, Quant, S: <1 m[IU]/mL (ref <5)

## 2010-12-16 LAB — BASIC METABOLIC PANEL
BUN: 8 mg/dL (ref 6–23)
CO2: 24 mEq/L (ref 19–32)
CO2: 20 mEq/L (ref 19–32)
Calcium: 8.8 mg/dL (ref 8.4–10.5)
Chloride: 99 mEq/L (ref 96–112)
Creatinine, Ser: 0.61 mg/dL (ref 0.50–1.10)
Creatinine, Ser: 0.58 mg/dL (ref 0.50–1.10)
GFR calc Af Amer: >90 mL/min (ref >90)
GFR calc non Af Amer: >90 mL/min (ref >90)
Glucose, Bld: 65 mg/dL — ABNORMAL LOW (ref 70–99)

## 2010-12-16 LAB — URINALYSIS, ROUTINE W REFLEX MICROSCOPIC
Bilirubin Urine: NEGATIVE
Glucose, UA: NEGATIVE mg/dL
Ketones, ur: >80 mg/dL — AB
Leukocytes, UA: NEGATIVE
Nitrite: NEGATIVE
Protein, ur: NEGATIVE mg/dL
Specific Gravity, Urine: 1.012 (ref 1.005–1.030)
pH: 7 (ref 5.0–8.0)

## 2010-12-16 LAB — HEPATITIS PANEL, ACUTE
HCV Ab: NEGATIVE
Hep B C IgM: NEGATIVE

## 2010-12-16 LAB — GC/CHLAMYDIA PROBE AMP, URINE
Chlamydia, Swab/Urine, PCR: NEGATIVE
GC Probe Amp, Urine: NEGATIVE

## 2010-12-16 MED ORDER — POTASSIUM CHLORIDE 10 MEQ/100ML IV SOLN
10.0000 meq | INTRAVENOUS | Status: AC
  Administered 2010-12-16 (×6): 10 meq via INTRAVENOUS
  Filled 2010-12-16 (×6): qty 100

## 2010-12-16 MED ORDER — DEXTROSE-NACL 5-0.9 % IV SOLN
INTRAVENOUS | Status: DC
  Administered 2010-12-16 – 2010-12-17 (×2): via INTRAVENOUS

## 2010-12-16 MED ORDER — FLEET ENEMA 7-19 GM/118ML RE ENEM
1.0000 | ENEMA | Freq: Once | RECTAL | Status: AC
  Administered 2010-12-16: 1 via RECTAL
  Filled 2010-12-16: qty 1

## 2010-12-16 MED ORDER — IOHEXOL 300 MG/ML  SOLN
80.0000 mL | Freq: Once | INTRAMUSCULAR | Status: AC | PRN
  Administered 2010-12-16: 80 mL via INTRAVENOUS

## 2010-12-16 NOTE — Progress Notes (Signed)
Patient vomited a small amount tonight and was given phenergan IV, Patient vomited jello that she had eaten earlier.  Will continue to monitor

## 2010-12-16 NOTE — Progress Notes (Signed)
Physician phone about elevated blood pressure and patient still vomiting.  New orders given to make patient NPO and no new orders given for blood pressure, will continue to monitor

## 2010-12-16 NOTE — Progress Notes (Signed)
Subjective: Patient states she is feeling a little better today. She states she is still nauseated and vomiting with the last episode at approximately 9 AM to this morning. She states she is still having abdominal pain approximately 6/10. She states the nurses not offered her the phosphate enema even though she's not had a bowel movement in over 5 days. She did not receive the soapsuds enema yesterday either. She states she still not had a bowel movement but has passed a little flatus today. She is hungry but did not tolerate clears.  She states she has supposed to have started her menstrual cycle on the 14th. Has not yet had a period.  Objective: Vital signs in last 24 hours: Filed Vitals:   12/15/10 2112 12/15/10 2114 12/16/10 0514 12/16/10 0537  BP: 162/111 150/92 167/104 153/106  Pulse: 68  63   Temp: 99.1 F (37.3 C)  99 F (37.2 C)   TempSrc:      Resp: 18  18   Height:      Weight:      SpO2: 100%  99%    Weight change:   Intake/Output Summary (Last 24 hours) at 12/16/10 1115 Last data filed at 12/16/10 0606  Gross per 24 hour  Intake   3615 ml  Output      0 ml  Net   3615 ml   Physical Exam: Vitals reviewed. General: resting in bed, NAD Cardiac: RRR, no rubs, murmurs or gallops Pulm: clear to auscultation bilaterally, no wheezes, rales, or rhonchi Abd: soft, mild bilateral lower quadrant pain to palpation.  Mild epigastric tenderness to palpation, nondistended, BS very faint Ext: warm and well perfused, no pedal edema Neuro: alert and oriented X3, cranial nerves II-XII grossly intact, no focal deficits  Lab Results: Basic Metabolic Panel:  Lab 12/16/10 1610 12/15/10 0705 12/13/10 1913  NA 135 136 --  K 3.3* 4.0 --  CL 99 102 --  CO2 24 22 --  GLUCOSE 78 71 --  BUN 8 9 --  CREATININE 0.61 0.61 --  CALCIUM 9.2 8.9 --  MG -- -- 1.8  PHOS -- -- --   Liver Function Tests:  Lab 12/13/10 1248 12/12/10 1409  AST 16 18  ALT 10 12  ALKPHOS 38* 40  BILITOT 0.3  0.3  PROT 6.9 7.6  ALBUMIN 3.5 4.0    Lab 12/13/10 1913  LIPASE 15  AMYLASE --   CBC:  Lab 12/14/10 0500 12/13/10 1248 12/12/10 1409  WBC 5.3 7.7 --  NEUTROABS -- -- 5.2  HGB 13.2 12.9 --  HCT 38.6 38.0 --  MCV 88.9 89.8 --  PLT 172 187 --     hCG, quantitative, pregnancy     Collected: 12/16/10 0942    Reference range: <5 mIU/mL    Value: <1    Comment:               GEST. AGE      CONC.  (mIU/mL)        <=1 WEEK        5 - 50          2 WEEKS       50 - 500          3 WEEKS       100 - 10,000          4 WEEKS     1,000 - 30,000          5 WEEKS  3,500 - 115,000        6-8 WEEKS     12,000 - 270,000         12 WEEKS     15,000 - 220,000                  FEMALE AND NON-PREGNANT FEMALE:          LESS THAN 5 mIU/mL       Micro Results: Recent Results (from the past 240 hour(s))  URINE CULTURE     Status: Normal   Collection Time   12/13/10 12:57 PM      Component Value Range Status Comment   Specimen Description URINE, CATHETERIZED   Final    Special Requests NONE   Final    Setup Time [id number]   Final    Colony Count 45,000 COLONIES/ML   Final    Culture     Final    Value: Multiple bacterial morphotypes present, none predominant. Suggest appropriate recollection if clinically indicated.   Report Status 12/14/2010 FINAL   Final    Studies/Results: Dg Abd Acute W/chest  12/14/2010  *RADIOLOGY REPORT*  Clinical Data: Nausea/vomiting, evaluate for ileus or SBO  ACUTE ABDOMEN SERIES (ABDOMEN 2 VIEW & CHEST 1 VIEW)  Comparison: 12/13/2010  Findings: Lungs are clear. No pleural effusion or pneumothorax.  Cardiomediastinal silhouette is within normal limits.  Nonobstructive bowel gas pattern.  No evidence of free air under the diaphragm on the upright view.  Visualized osseous structures are within normal limits.  IMPRESSION: No evidence of acute cardiopulmonary disease.  No evidence of small bowel obstruction or free air.  Original Report Authenticated By:  Charline Bills, M.D.   Medications: I have reviewed the patient's current medications. Scheduled Meds:    . enoxaparin  40 mg Subcutaneous Q24H  . ondansetron (ZOFRAN) IV  4 mg Intravenous Q6H  . pantoprazole (PROTONIX) IV  40 mg Intravenous Q24H  . potassium chloride  10 mEq Intravenous Q1 Hr x 6   Continuous Infusions:    . sodium chloride 150 mL/hr at 12/16/10 0730   PRN Meds:.ketorolac, morphine, promethazine, promethazine, sodium phosphate Assessment/Plan: 1. Nausea and vomiting:  Unsure of the cause.  Still has not had a bowel movement. Lack of period raises the question of pregnancy however pregnancy test is negative. Possible etiologies include atypical menstrual symptoms, gastroenteritis, or potentially ileus..  Zofran is working better for control of her nausea and vomiting. -- Clear liquids tonight. -- Ondansetron 4 mg every 6 hours when necessary for nausea  -- Fleets enema first today and potentially soapsuds enema. -- As last UA and culture were dirty we will repeat these today -- Abdominal/pelvis CT -- Pantoprazole   2. abdominal pain:  Ms. Gerre Couch is currently written for morphine 1-2 mg IV every 4 hours as needed for abdominal pain. Abdominal pain is much improved.  Will continue to monitor.   3. Hypokalemia: Potassium is still low. We will give runs of potassium and recheck BMP in 6-1/2 hours.  4. DVT prophylaxis-Lovenox   LOS: 4 days   Syeda Prickett 12/16/2010, 11:15 AM

## 2010-12-17 DIAGNOSIS — E876 Hypokalemia: Secondary | ICD-10-CM

## 2010-12-17 HISTORY — DX: Hypokalemia: E87.6

## 2010-12-17 LAB — BASIC METABOLIC PANEL
Chloride: 101 mEq/L (ref 96–112)
GFR calc Af Amer: >90 mL/min (ref >90)
GFR calc non Af Amer: >90 mL/min (ref >90)
Potassium: 3.2 mEq/L — ABNORMAL LOW (ref 3.5–5.1)
Sodium: 133 mEq/L — ABNORMAL LOW (ref 135–145)

## 2010-12-17 LAB — URINE CULTURE
Colony Count: NO GROWTH
Culture  Setup Time: 201211192340

## 2010-12-17 LAB — CBC
MCV: 85.7 fL (ref 78.0–100.0)
Platelets: 210 10*3/uL (ref 150–400)
RDW: 11.9 % (ref 11.5–15.5)
WBC: 5.2 10*3/uL (ref 4.0–10.5)

## 2010-12-17 MED ORDER — DOCUSATE SODIUM 100 MG PO CAPS: 100.0000 mg | ORAL_CAPSULE | Freq: Two times a day (BID) | ORAL | Status: AC

## 2010-12-17 MED ORDER — POTASSIUM CHLORIDE CRYS ER 20 MEQ PO TBCR: 20.0000 meq | EXTENDED_RELEASE_TABLET | Freq: Two times a day (BID) | ORAL | Status: DC

## 2010-12-17 MED ORDER — POTASSIUM CHLORIDE 10 MEQ/100ML IV SOLN
10.0000 meq | INTRAVENOUS | Status: DC
  Filled 2010-12-17 (×6): qty 100

## 2010-12-17 MED ORDER — POTASSIUM CHLORIDE CRYS ER 20 MEQ PO TBCR
40.0000 meq | EXTENDED_RELEASE_TABLET | Freq: Once | ORAL | Status: AC
  Administered 2010-12-17: 40 meq via ORAL
  Filled 2010-12-17: qty 2

## 2010-12-17 MED ORDER — POTASSIUM CHLORIDE CRYS ER 20 MEQ PO TBCR
40.0000 meq | EXTENDED_RELEASE_TABLET | Freq: Two times a day (BID) | ORAL | Status: DC
  Administered 2010-12-17: 40 meq via ORAL
  Filled 2010-12-17: qty 2

## 2010-12-17 NOTE — Discharge Summary (Signed)
Internal Medicine Teaching [facility] Discharge Note  Name: Erin Pitts MRN: [id number] DOB: April 19, 1980 30 y.o.  Date of Admission: 12/12/2010  1:31 PM Date of Discharge: 12/17/2010 Attending Physician: Blanch Media  Discharge Diagnosis: Principal Problem:  *Abdominal pain, acute Active Problems:  Prolonged severe nausea and vomiting  Hypokalemia   Discharge Medications: Current Discharge Medication List    START taking these medications   Details  potassium chloride SA (K-DUR,KLOR-CON) 20 MEQ tablet Take 1 tablet (20 mEq total) by mouth 2 (two) times daily. Qty: 60 tablet, Refills: 1  docusate sodium (COLACE) 100 MG capsule  Take 1 capsule (100 mg total) by mouth 2 (two) times daily., Qty: 60 tablet, Refills: 0    CONTINUE these medications which have NOT CHANGED   Details  ondansetron (ZOFRAN) 4 MG tablet Take 4 mg by mouth every 6 (six) hours as needed. FOR NAUSEA & VOMITING       STOP taking these medications     HYDROcodone-acetaminophen (NORCO) 5-325 MG per tablet         Disposition and follow-up:   ErinErin Pitts was discharged from [facility] in Stable condition.    Follow-up Appointments:  Ms. Gerre Pitts has a followup appointment with Dr. Normand Sloop a gynecologist. At her  appointment the following items should be addressed: 1) patient has a history since she was 30 years old of nausea, vomiting and very painful menstrual cycles, please assess for potential endometriosis or other pathology   Ms. Gerre Pitts has a followup appointment with Dr. Tonny Branch in the [facility]. At this appointment following item should be addressed: 1) this please check basic metabolic panel for resolution of hypokalemia 2) please assess for resolution of constipation.   Discharge Orders    Future Appointments: Provider: Department: Dept Phone: Center:     12/31/2010 3:00 AM   Naima Dillard     Gynecology      [phone]    01/02/2011  8:15 AM Leodis Sias [facility] [phone] [facility]                                             Future Orders Please Complete By Expires   Diet - low sodium heart healthy      Increase activity slowly      Call MD for:  temperature >100.4      Call MD for:  persistant nausea and vomiting      Call MD for:  severe uncontrolled pain      Call MD for:  difficulty breathing, headache or visual disturbances      Call MD for:  persistant dizziness or light-headedness      Call MD for:  extreme fatigue      Driving Restrictions      Comments:   Do not drive or operate machinery while you are taking your opoid pain medicine (Norco).      Consultations:  None  Procedures Performed:  Ct Abdomen Pelvis W Contrast  12/16/2010  *RADIOLOGY REPORT*  Clinical Data: Intractable nausea  CT ABDOMEN AND PELVIS WITH CONTRAST  Technique:  Multidetector CT imaging of the abdomen and pelvis was performed following the standard protocol during bolus administration of intravenous contrast.  Contrast: 80mL OMNIPAQUE IOHEXOL 300 MG/ML IV SOLN  Comparison: None.  Findings: Liver, gallbladder, spleen, pancreas, adrenal glands, kidneys are within normal  limits.  Left ovarian vein is prominent with a refluxing contrast.  Normal appendix on image 56.  Terminal ileum is unremarkable. Bladder, uterus, and right ovary are within normal limits.  1.8 cm hypodensity is present in the left ovary.  Abnormal adenopathy.  There is deformity of the inferior pubic rami bilaterally which has a chronic appearance.  IMPRESSION: No acute intra-abdominal or intrapelvic pathology.  Prominent left ovarian vein with reflux of contrast.  No evidence of appendicitis or acute bowel pathology.  Original Report Authenticated By: Donavan Burnet, M.D.   Dg Abd Acute W/chest  12/14/2010  *RADIOLOGY REPORT*  Clinical Data: Nausea/vomiting, evaluate for ileus or SBO  ACUTE ABDOMEN SERIES (ABDOMEN 2 VIEW & CHEST 1 VIEW)  Comparison:  12/13/2010  Findings: Lungs are clear. No pleural effusion or pneumothorax.  Cardiomediastinal silhouette is within normal limits.  Nonobstructive bowel gas pattern.  No evidence of free air under the diaphragm on the upright view.  Visualized osseous structures are within normal limits.  IMPRESSION: No evidence of acute cardiopulmonary disease.  No evidence of small bowel obstruction or free air.  Original Report Authenticated By: Charline Bills, M.D.   Acute Abdominal Series  12/13/2010  *RADIOLOGY REPORT*  Clinical Data: Abdominal pain.  Nausea and vomiting.  Constipation.  ACUTE ABDOMEN SERIES (ABDOMEN 2 VIEW & CHEST 1 VIEW) 12/13/2010:  Comparison: None.  Findings: Bowel gas pattern unremarkable without evidence of obstruction or significant ileus.  No evidence of free intraperitoneal air or significant air fluid levels on the erect image.  No abnormal calcifications.  Visible psoas margins. Regional skeleton unremarkable.  Cardiomediastinal silhouette unremarkable.  Lungs clear.  No pleural effusions.  IMPRESSION: No acute abdominal or pulmonary abnormality.  Original Report Authenticated By: Arnell Sieving, M.D.    Admission HPI:  History - key components related to admission:[name] is a 30 year old woman who presents with a four-day history of nausea, vomiting and lower abdominal pain.she states that her lower abdominal pain is crampy in nature and similar to her menstrual cramps. Her vomitus is nonbilious and nonbloody. On November 16 she presented to the [facility] emergency department for similar complaints. Her laboratory work was unremarkable as was an ultrasound. Her symptoms persisted and she returned to the emergency department at [facility]. She was there for 7 hours became frustrated with her weight and [facility]. She notes some chills but denies fevers, shakes, recent head trauma, sick contacts, eating out, or eating any food that  did not taste right. She denies any diarrhea or bright red blood per rectum. In fact she has not hd a bowel movement in 7 days which is unusual for her. She also states that she has not passed any gas for the last 3 days. She denies a history of surgery of the abdomen, hernias, or abnormal lumps on the abdomen or in the groin. She was admitted for further evaluation and therapy for her nausea.   Admission Physical Exam - key components related to admission:  Filed Vitals:    12/13/10 1727  12/13/10 2100  12/14/10 0608  12/14/10 1356   BP:  151/89  143/89   143/87   Pulse:  53  59  67  66   Temp:  99.6 F (37.6 C)  98.7 F (37.1 C)  98.9 F (37.2 C)  98.7 F (37.1 C)   TempSrc:  Oral      Resp:  18  20  19   18  Height:  5\' 11"  (1.803 m)      Weight:  188 lb 11.2 oz (85.594 kg)      SpO2:  100%  100%  99%  99%    Gen.: Well-developed, well-nourished, young woman sitting in bed. Periodically vomiting during the interview. The vomit had a green tinge but there was no blood or coffee grounds observed.  Lungs: Clear to auscultation and percussion bilaterally without wheezes, rhonchi or rales.  Heart: Regular rate and rhythm without murmurs, rubs, or gallops.  Abdomen: Soft, mildly tender in the bilateral lower quadrants, (L > R), very hypoactive bowel sounds, without surgical scars, masses, hepatosplenomegaly, or rebound.  Inguinal area: No palpable masses, knots, or defects.  Extremities: Without edema.   Admission Lab results:   Basic Metabolic Panel:   Basename  12/14/10 1415  12/14/10 0500  12/13/10 1913   NA  138  138  --   K  3.4*  2.7*  --   CL  103  103  --   CO2  24  26  --   GLUCOSE  76  83  --   BUN  7  8  --   CREATININE  0.60  0.63  --   CALCIUM  8.4  8.6  --   MG  --  --  1.8   PHOS  --  --  --    Liver Function Tests:   Basename  12/13/10 1248  12/12/10 1409   AST  16  18   ALT  10  12   ALKPHOS  38*  40   BILITOT  0.3  0.3   PROT  6.9  7.6   ALBUMIN  3.5   4.0     Basename  12/13/10 1913   LIPASE  15   AMYLASE  --    CBC:   Basename  12/14/10 0500  12/13/10 1248  12/12/10 1409   WBC  5.3  7.7  --   NEUTROABS  --  --  5.2   HGB  13.2  12.9  --   HCT  38.6  38.0  --   MCV  88.9  89.8  --   PLT  172  187  --    HIV = NON REACTIVE (11/16 1913) GC Probe Amp, Urine = NEGATIVE  Chlamydia, Swab/Urine, PCR = NEGATIVE  hepatitis serologies all negative  12/16/10  Urine dipstick shows negative for all components and positive for ketones. Micro exam: not done.   hCG, quantitative, pregnancy    Collected: 12/16/10 0942     Reference range: <5 mIU/mL     Value: <1    Comment:                GEST. AGE      CONC.  (mIU/mL)         <=1 WEEK        5 - 50           2 WEEKS       50 - 500           3 WEEKS       100 - 10,000           4 WEEKS     1,000 - 30,000           5 WEEKS     3,500 - 115,000         6-8 WEEKS     12,000 - 270,000  12 WEEKS     15,000 - 220,000                    FEMALE AND NON-PREGNANT FEMALE:           LESS THAN 5 mIU/mL   Drugs of Abuse    Component  Value  Date/Time    LABOPIA  NONE DETECTED  12/14/2010 0651    COCAINSCRNUR  NONE DETECTED  12/14/2010 0651    LABBENZ  NONE DETECTED  12/14/2010 0651    AMPHETMU  NONE DETECTED  12/14/2010 0651    THCU  POSITIVE*  12/14/2010 0651    LABBARB  NONE DETECTED  12/14/2010 0651     Hospital Course by problem list: Principal Problem: 1) Abdominal pain, acute - the etiology of Erin Pitts's abdominal pain is unclear. She states she has had nearly monthly episodes since she was 27. The current episode was much more severe though she has been hospitalized in the past for this before. Possible etiologies include constipation followed by atypical menstrual symptoms, or gastroenteritis. Ms. Gerre Pitts was afebrile during her entire admission. She was placed n.p.o. and pain was controlled with IV morphine and ketorolac. Plain films showed no obstruction. Urinalysis was  negative. HIV, Chlamydia, gonorrhea were all negative. Hepatitis or ologies were negative. Abdominal CT showed only a 1.8 cm hypodense lesion in the left ovary, most compatible with a physiologic cyst. There was no evidence of ileus or SBO. Ms. Gerre Pitts was in unable to tolerate clear liquid diet for 3 days, however after drinking oral contrast for her CT scan she started to feel better. Her symptoms largely resolved after Fleets enema yielded 2 bowel movements.  She states she has not yet had her period at the time of discharge, though it was due on November 14. Urine pregnancy test was negative x2. Ms. Gerre Pitts was discharged on docusate stool softener.  2) Prolonged severe nausea and vomiting - see above. Nausea was managed with Zofran and Phenergan IV, she had rapid resolution of her nausea and vomiting after she had a bowel movement. On the day of discharge she was able to tolerate soft bland diet without difficulty.  3) Hypokalemia - in the setting of persistent vomiting Ms. Gerre Pitts was very hypokalemic requiring frequent potassium supplementations. She was discharged on oral potassium supplementation after she was able to take medicine by mouth.   Discharge Vitals:  BP 128/84  Pulse 89  Temp(Src) 98.9 F (37.2 C) (Oral)  Resp 20  Ht 5\' 11"  (1.803 m)  Wt 188 lb 11.2 oz (85.594 kg)  BMI 26.32 kg/m2  SpO2 97%  LMP 11/10/2010  Discharge Labs:  Results for orders placed during the hospital encounter of 12/12/10 (from the past 24 hour(s))  BASIC METABOLIC PANEL     Status: Abnormal   Collection Time   12/16/10  5:20 PM      Component Value Range   Sodium 133 (*) 135 - 145 (mEq/L)   Potassium 4.1  3.5 - 5.1 (mEq/L)   Chloride 98  96 - 112 (mEq/L)   CO2 20  19 - 32 (mEq/L)   Glucose, Bld 65 (*) 70 - 99 (mg/dL)   BUN 7  6 - 23 (mg/dL)   Creatinine, Ser 1.61  0.50 - 1.10 (mg/dL)   Calcium 8.8  8.4 - 09.6 (mg/dL)   GFR calc non Af Amer >90  >90 (mL/min)   GFR calc Af Amer >90  >90 (mL/min)    CBC  Status: Abnormal   Collection Time   12/17/10  8:17 AM      Component Value Range   WBC 5.2  4.0 - 10.5 (K/uL)   RBC 4.42  3.87 - 5.11 (MIL/uL)   Hemoglobin 13.7  12.0 - 15.0 (g/dL)   HCT 16.1  09.6 - 04.5 (%)   MCV 85.7  78.0 - 100.0 (fL)   MCH 31.0  26.0 - 34.0 (pg)   MCHC 36.1 (*) 30.0 - 36.0 (g/dL)   RDW 40.9  81.1 - 91.4 (%)   Platelets 210  150 - 400 (K/uL)  BASIC METABOLIC PANEL     Status: Abnormal   Collection Time   12/17/10  8:17 AM      Component Value Range   Sodium 133 (*) 135 - 145 (mEq/L)   Potassium 3.2 (*) 3.5 - 5.1 (mEq/L)   Chloride 101  96 - 112 (mEq/L)   CO2 24  19 - 32 (mEq/L)   Glucose, Bld 96  70 - 99 (mg/dL)   BUN 6  6 - 23 (mg/dL)   Creatinine, Ser 7.82  0.50 - 1.10 (mg/dL)   Calcium 8.6  8.4 - 95.6 (mg/dL)   GFR calc non Af Amer >90  >90 (mL/min)   GFR calc Af Amer >90  >90 (mL/min)    Signed: Vikash Nest 12/17/2010, 2:12 PM

## 2010-12-17 NOTE — Progress Notes (Signed)
Subjective: [name] states she is feeling dramatically better today. She states she is no longer nauseated. She has not vomited since 12 noon yesterday. She states that the oral contrast she had a drink for her CT scan "loosened things up".  she reports she did get a fleets enema after her CT scan and has since had 2 bowel movements. These did not have any blood. She states she is very hungry and would like to eat. However she feels that the food items available on the clear tray are unappetizing and would like to try more solid foods. She states she has supposed to have started her menstrual cycle on the 14th. Has not yet had a period.She states that her symptoms usually resolve after one week she is feeling as though they're getting better. She does endorse some fullness in her bladder.  Objective: Vital signs in last 24 hours: Filed Vitals:   12/16/10 0537 12/16/10 1316 12/16/10 2113 12/17/10 0518  BP: 153/106 146/93 130/84 127/85  Pulse:  62 85 59  Temp:  99 F (37.2 C) 98.5 F (36.9 C) 98.5 F (36.9 C)  TempSrc:    Oral  Resp:  18 18 17   Height:      Weight:      SpO2:  96% 100% 100%   Weight change:   Intake/Output Summary (Last 24 hours) at 12/17/10 0746 Last data filed at 12/16/10 1745  Gross per 24 hour  Intake   1400 ml  Output      1 ml  Net   1399 ml   Physical Exam: Vitals reviewed. General: resting in bed, NAD Cardiac: RRR, no rubs, murmurs or gallops Pulm: clear to auscultation bilaterally, no wheezes, rales, or rhonchi Abd: soft, mild bilateral lower quadrant pain to palpation.She states this gives her urinary urgency.  Abdomen is nondistended, BS very faint Ext: warm and well perfused, no pedal edema Neuro: alert and oriented X3, cranial nerves II-XII grossly intact, no focal deficits  Lab Results: Basic Metabolic Panel:  Lab 12/16/10 1610 12/16/10 0550 12/13/10 1913  NA 133* 135 --  K 4.1 3.3* --  CL 98 99 --  CO2 20 24 --  GLUCOSE 65* 78 --  BUN 7 8  --  CREATININE 0.58 0.61 --  CALCIUM 8.8 9.2 --  MG -- -- 1.8  PHOS -- -- --   Liver Function Tests:  Lab 12/13/10 1248 12/12/10 1409  AST 16 18  ALT 10 12  ALKPHOS 38* 40  BILITOT 0.3 0.3  PROT 6.9 7.6  ALBUMIN 3.5 4.0    Lab 12/13/10 1913  LIPASE 15  AMYLASE --   CBC:  Lab 12/14/10 0500 12/13/10 1248 12/12/10 1409  WBC 5.3 7.7 --  NEUTROABS -- -- 5.2  HGB 13.2 12.9 --  HCT 38.6 38.0 --  MCV 88.9 89.8 --  PLT 172 187 --     hCG, quantitative, pregnancy     Collected: 12/16/10 0942    Reference range: <5 mIU/mL    Value: <1    Comment:               GEST. AGE      CONC.  (mIU/mL)        <=1 WEEK        5 - 50          2 WEEKS       50 - 500          3 WEEKS  100 - 10,000          4 WEEKS     1,000 - 30,000          5 WEEKS     3,500 - 115,000        6-8 WEEKS     12,000 - 270,000         12 WEEKS     15,000 - 220,000                  FEMALE AND NON-PREGNANT FEMALE:          LESS THAN 5 mIU/mL   12/16/10  Urine dipstick shows negative for all components and positive for ketones.  Micro exam: not done.  Drugs of Abuse     Component Value Date/Time   LABOPIA NONE DETECTED 12/14/2010 0651   COCAINSCRNUR NONE DETECTED 12/14/2010 0651   LABBENZ NONE DETECTED 12/14/2010 0651   AMPHETMU NONE DETECTED 12/14/2010 0651   THCU POSITIVE* 12/14/2010 0651   LABBARB NONE DETECTED 12/14/2010 1610    Micro Results:  GC Probe Amp, Urine = NEGATIVE     Chlamydia, Swab/Urine, PCR = NEGATIVE   hepatitis serologies negative  Studies/Results: Ct Abdomen Pelvis W Contrast  12/16/2010  *RADIOLOGY REPORT*  Clinical Data: Intractable nausea  CT ABDOMEN AND PELVIS WITH CONTRAST  Technique:  Multidetector CT imaging of the abdomen and pelvis was performed following the standard protocol during bolus administration of intravenous contrast.  Contrast: 80mL OMNIPAQUE IOHEXOL 300 MG/ML IV SOLN  Comparison: None.  Findings: Liver, gallbladder, spleen, pancreas, adrenal glands,  kidneys are within normal limits.  Left ovarian vein is prominent with a refluxing contrast.  Normal appendix on image 56.  Terminal ileum is unremarkable. Bladder, uterus, and right ovary are within normal limits.  1.8 cm hypodensity is present in the left ovary.  Abnormal adenopathy.  There is deformity of the inferior pubic rami bilaterally which has a chronic appearance.  IMPRESSION: No acute intra-abdominal or intrapelvic pathology.  Prominent left ovarian vein with reflux of contrast.  No evidence of appendicitis or acute bowel pathology.  Original Report Authenticated By: Donavan Burnet, M.D.   Medications: I have reviewed the patient's current medications. Scheduled Meds:    . enoxaparin  40 mg Subcutaneous Q24H  . ondansetron (ZOFRAN) IV  4 mg Intravenous Q6H  . pantoprazole (PROTONIX) IV  40 mg Intravenous Q24H  . potassium chloride  10 mEq Intravenous Q1 Hr x 6  . sodium phosphate  1 enema Rectal Once   Continuous Infusions:    . dextrose 5 % and 0.9% NaCl 125 mL/hr at 12/16/10 1713  . DISCONTD: sodium chloride Stopped (12/16/10 1715)   PRN Meds:.iohexol, ketorolac, morphine, promethazine, promethazine, DISCONTD: sodium phosphate Assessment/Plan:  1. Nausea and vomiting:  Resolving quickly today with administration of oral contrast to drink and fleets enema. Unsure of the cause. The working diagnosis at this time is constipation followed by atypical menstrual symptoms, or gastroenteritis.  CT scan was negative. It did show a small 1.8 cm hypodensity within the left ovary and abnormal adenopathy. ? Could this be endometriosis causing her symptoms?  GC and Chlamydia negative. HIV negative. Hepatitis serologies negative. Urine pregnancy test negative. Zofran is working better for control of her nausea and vomiting. As she states that the actual food items on the clear tray make her nauseated and not the diet itself, so we will try a bland soft diet. I asked her to go very slowly with  this diet. -- Ondansetron 4 mg every 6 hours when necessary for nausea  --  MiraLAX today --  follow urine cultures. -- Pantoprazole  -- Mechanical soft bland diet. Patient is only to eat half a tray  2. abdominal pain:   abdominal pain is nearly resolved. [name] is currently written for  ketorolac and morphine 1-2 mg IV every 4 hours as needed for abdominal pain.  -- She has not required use of morphine since admission or ketorolac since the 18th.  3. Hypokalemia: Potassium  normal overnight. Results pending this morning.  will continue to follow.  4. DVT prophylaxis-Lovenox   LOS: 5 days   Zakyra Kukuk 12/17/2010, 7:46 AM

## 2010-12-17 NOTE — Progress Notes (Signed)
1`02/15/10 NSG 1550 Patient discharged to home with family, discharged instructions given and reviewed with patient.  Patient verbalized understanding. Skin intact at discharge, IV discharged and intact. Patient escorted to car via wheelchair by NT. Forbes Cellar, RN

## 2011-01-02 ENCOUNTER — Encounter: Payer: Medicaid Other | Admitting: Internal Medicine

## 2011-08-15 ENCOUNTER — Emergency Department: Payer: Self-pay | Admitting: Emergency Medicine

## 2011-08-15 LAB — URINALYSIS, COMPLETE
Bilirubin,UR: NEGATIVE
Blood: NEGATIVE
Glucose,UR: NEGATIVE mg/dL (ref 0–75)
Leukocyte Esterase: NEGATIVE
Nitrite: NEGATIVE
Squamous Epithelial: 4

## 2011-12-24 ENCOUNTER — Inpatient Hospital Stay: Payer: Self-pay | Admitting: Obstetrics and Gynecology

## 2011-12-24 LAB — BASIC METABOLIC PANEL
Calcium, Total: 8.6 mg/dL (ref 8.5–10.1)
Creatinine: 0.48 mg/dL — ABNORMAL LOW (ref 0.60–1.30)
EGFR (African American): >60
EGFR (Non-African Amer.): >60
Glucose: 120 mg/dL — ABNORMAL HIGH (ref 65–99)
Potassium: 3.8 mmol/L (ref 3.5–5.1)
Sodium: 138 mmol/L (ref 136–145)

## 2011-12-24 LAB — URINALYSIS, COMPLETE
Leukocyte Esterase: NEGATIVE
Ph: 8 (ref 4.5–8.0)
Protein: 30
RBC,UR: 3 /HPF (ref 0–5)

## 2011-12-25 LAB — CBC WITH DIFFERENTIAL/PLATELET
Basophil #: 0 10*3/uL (ref 0.0–0.1)
Eosinophil #: 0 10*3/uL (ref 0.0–0.7)
HCT: 33.5 % — ABNORMAL LOW (ref 35.0–47.0)
Lymphocyte %: 7.3 %
MCH: 31.6 pg (ref 26.0–34.0)
MCV: 93 fL (ref 80–100)
Neutrophil %: 89.3 %
Platelet: 221 10*3/uL (ref 150–440)
RBC: 3.62 10*6/uL — ABNORMAL LOW (ref 3.80–5.20)

## 2011-12-25 LAB — AMYLASE: Amylase: 103 U/L (ref 25–115)

## 2011-12-29 LAB — URINALYSIS, COMPLETE
Blood: NEGATIVE
Glucose,UR: NEGATIVE mg/dL (ref 0–75)
Nitrite: NEGATIVE
Specific Gravity: 1.023 (ref 1.003–1.030)

## 2011-12-29 LAB — BASIC METABOLIC PANEL
BUN: 5 mg/dL — ABNORMAL LOW (ref 7–18)
Chloride: 103 mmol/L (ref 98–107)
EGFR (Non-African Amer.): >60
Glucose: 71 mg/dL (ref 65–99)
Osmolality: 266 (ref 275–301)
Potassium: 2.7 mmol/L — ABNORMAL LOW (ref 3.5–5.1)
Sodium: 135 mmol/L — ABNORMAL LOW (ref 136–145)

## 2011-12-29 LAB — DRUG SCREEN, URINE
Amphetamines, Ur Screen: NEGATIVE (ref ?–1000)
Barbiturates, Ur Screen: NEGATIVE (ref ?–200)
MDMA (Ecstasy)Ur Screen: NEGATIVE (ref ?–500)
Methadone, Ur Screen: NEGATIVE (ref ?–300)

## 2011-12-30 ENCOUNTER — Inpatient Hospital Stay: Payer: Self-pay | Admitting: Obstetrics and Gynecology

## 2011-12-30 LAB — BASIC METABOLIC PANEL
Anion Gap: 7 (ref 7–16)
BUN: 5 mg/dL — ABNORMAL LOW (ref 7–18)
EGFR (African American): >60
EGFR (Non-African Amer.): >60
Glucose: 73 mg/dL (ref 65–99)
Osmolality: 273 (ref 275–301)
Potassium: 3 mmol/L — ABNORMAL LOW (ref 3.5–5.1)

## 2012-03-06 ENCOUNTER — Observation Stay: Payer: Self-pay

## 2012-03-06 LAB — URINALYSIS, COMPLETE
Blood: NEGATIVE
Glucose,UR: NEGATIVE mg/dL (ref 0–75)
Nitrite: NEGATIVE
Ph: 7 (ref 4.5–8.0)
Protein: 75
RBC,UR: 1 /HPF (ref 0–5)
Specific Gravity: 1.015 (ref 1.003–1.030)
Squamous Epithelial: 22
WBC UR: 11 /HPF (ref 0–5)

## 2012-03-07 LAB — CBC WITH DIFFERENTIAL/PLATELET
Basophil #: 0 10*3/uL (ref 0.0–0.1)
Basophil %: 0.4 %
Eosinophil #: 0.1 10*3/uL (ref 0.0–0.7)
Eosinophil %: 0.5 %
HCT: 32.2 % — ABNORMAL LOW (ref 35.0–47.0)
HGB: 10.8 g/dL — ABNORMAL LOW (ref 12.0–16.0)
Lymphocyte %: 12.7 %
MCH: 30.4 pg (ref 26.0–34.0)
MCHC: 33.6 g/dL (ref 32.0–36.0)
MCV: 90 fL (ref 80–100)
Monocyte #: 0.9 x10 3/mm (ref 0.2–0.9)
Platelet: 219 10*3/uL (ref 150–440)
RBC: 3.56 10*6/uL — ABNORMAL LOW (ref 3.80–5.20)
RDW: 13.1 % (ref 11.5–14.5)
WBC: 10.6 10*3/uL (ref 3.6–11.0)

## 2012-03-07 LAB — BASIC METABOLIC PANEL
Anion Gap: 9 (ref 7–16)
Calcium, Total: 8.2 mg/dL — ABNORMAL LOW (ref 8.5–10.1)
Chloride: 108 mmol/L — ABNORMAL HIGH (ref 98–107)
Co2: 24 mmol/L (ref 21–32)
Creatinine: 0.5 mg/dL — ABNORMAL LOW (ref 0.60–1.30)
EGFR (Non-African Amer.): >60
Glucose: 81 mg/dL (ref 65–99)

## 2012-03-30 ENCOUNTER — Observation Stay: Payer: Self-pay | Admitting: Obstetrics and Gynecology

## 2012-04-02 ENCOUNTER — Inpatient Hospital Stay: Payer: Self-pay | Admitting: Obstetrics and Gynecology

## 2012-04-02 LAB — CBC WITH DIFFERENTIAL/PLATELET
Basophil #: 0.1 10*3/uL (ref 0.0–0.1)
Basophil %: 0.6 %
HCT: 33.6 % — ABNORMAL LOW (ref 35.0–47.0)
Lymphocyte #: 1.2 10*3/uL (ref 1.0–3.6)
Lymphocyte %: 9.8 %
MCHC: 33.3 g/dL (ref 32.0–36.0)
Monocyte %: 5.7 %
Neutrophil %: 83.2 %
Platelet: 216 10*3/uL (ref 150–440)
RBC: 3.7 10*6/uL — ABNORMAL LOW (ref 3.80–5.20)
WBC: 12.5 10*3/uL — ABNORMAL HIGH (ref 3.6–11.0)

## 2012-04-03 LAB — HEMATOCRIT: HCT: 28 % — ABNORMAL LOW (ref 35.0–47.0)

## 2012-04-06 LAB — PATHOLOGY REPORT

## 2012-04-08 ENCOUNTER — Inpatient Hospital Stay: Payer: Self-pay | Admitting: Obstetrics & Gynecology

## 2012-04-08 LAB — CBC WITH DIFFERENTIAL/PLATELET
Basophil #: 0.1 10*3/uL (ref 0.0–0.1)
Eosinophil #: 0.1 10*3/uL (ref 0.0–0.7)
HGB: 11 g/dL — ABNORMAL LOW (ref 12.0–16.0)
Lymphocyte #: 1.3 10*3/uL (ref 1.0–3.6)
Lymphocyte %: 14.7 %
Monocyte %: 5.3 %
Neutrophil #: 7 10*3/uL — ABNORMAL HIGH (ref 1.4–6.5)
Platelet: 304 10*3/uL (ref 150–440)
RDW: 13.2 % (ref 11.5–14.5)

## 2012-04-08 LAB — URINALYSIS, COMPLETE
Glucose,UR: NEGATIVE mg/dL (ref 0–75)
Ketone: NEGATIVE
Nitrite: NEGATIVE
Ph: 9 (ref 4.5–8.0)
RBC,UR: 4 /HPF (ref 0–5)
Specific Gravity: 1.017 (ref 1.003–1.030)
Squamous Epithelial: 4
WBC UR: 15 /HPF (ref 0–5)

## 2012-04-08 LAB — COMPREHENSIVE METABOLIC PANEL
BUN: 7 mg/dL (ref 7–18)
Calcium, Total: 8.8 mg/dL (ref 8.5–10.1)
Chloride: 105 mmol/L (ref 98–107)
Co2: 27 mmol/L (ref 21–32)
EGFR (Non-African Amer.): >60
Potassium: 3.3 mmol/L — ABNORMAL LOW (ref 3.5–5.1)
SGPT (ALT): 23 U/L (ref 12–78)

## 2012-04-09 LAB — BASIC METABOLIC PANEL
BUN: 6 mg/dL — ABNORMAL LOW (ref 7–18)
Chloride: 109 mmol/L — ABNORMAL HIGH (ref 98–107)
Co2: 28 mmol/L (ref 21–32)
Glucose: 100 mg/dL — ABNORMAL HIGH (ref 65–99)
Osmolality: 279 (ref 275–301)
Potassium: 3.2 mmol/L — ABNORMAL LOW (ref 3.5–5.1)
Sodium: 141 mmol/L (ref 136–145)

## 2012-04-09 LAB — CBC WITH DIFFERENTIAL/PLATELET
Basophil #: 0.1 10*3/uL (ref 0.0–0.1)
Lymphocyte #: 1.5 10*3/uL (ref 1.0–3.6)
MCH: 30.2 pg (ref 26.0–34.0)
MCHC: 33.5 g/dL (ref 32.0–36.0)
MCV: 90 fL (ref 80–100)
Monocyte #: 0.6 x10 3/mm (ref 0.2–0.9)
Neutrophil %: 76.5 %
Platelet: 296 10*3/uL (ref 150–440)
RBC: 3.45 10*6/uL — ABNORMAL LOW (ref 3.80–5.20)

## 2012-04-09 LAB — MAGNESIUM: Magnesium: 1.7 mg/dL — ABNORMAL LOW

## 2012-04-14 LAB — CULTURE, BLOOD (SINGLE)

## 2012-10-19 IMAGING — CR DG ABDOMEN ACUTE W/ 1V CHEST
3 series · 3 of 3 positions shown · non-contrast
Comparison: 12/13/2010

CLINICAL DATA: Nausea/vomiting, evaluate for ileus or SBO

ACUTE ABDOMEN SERIES (ABDOMEN 2 VIEW & CHEST 1 VIEW)

[x chest ap]
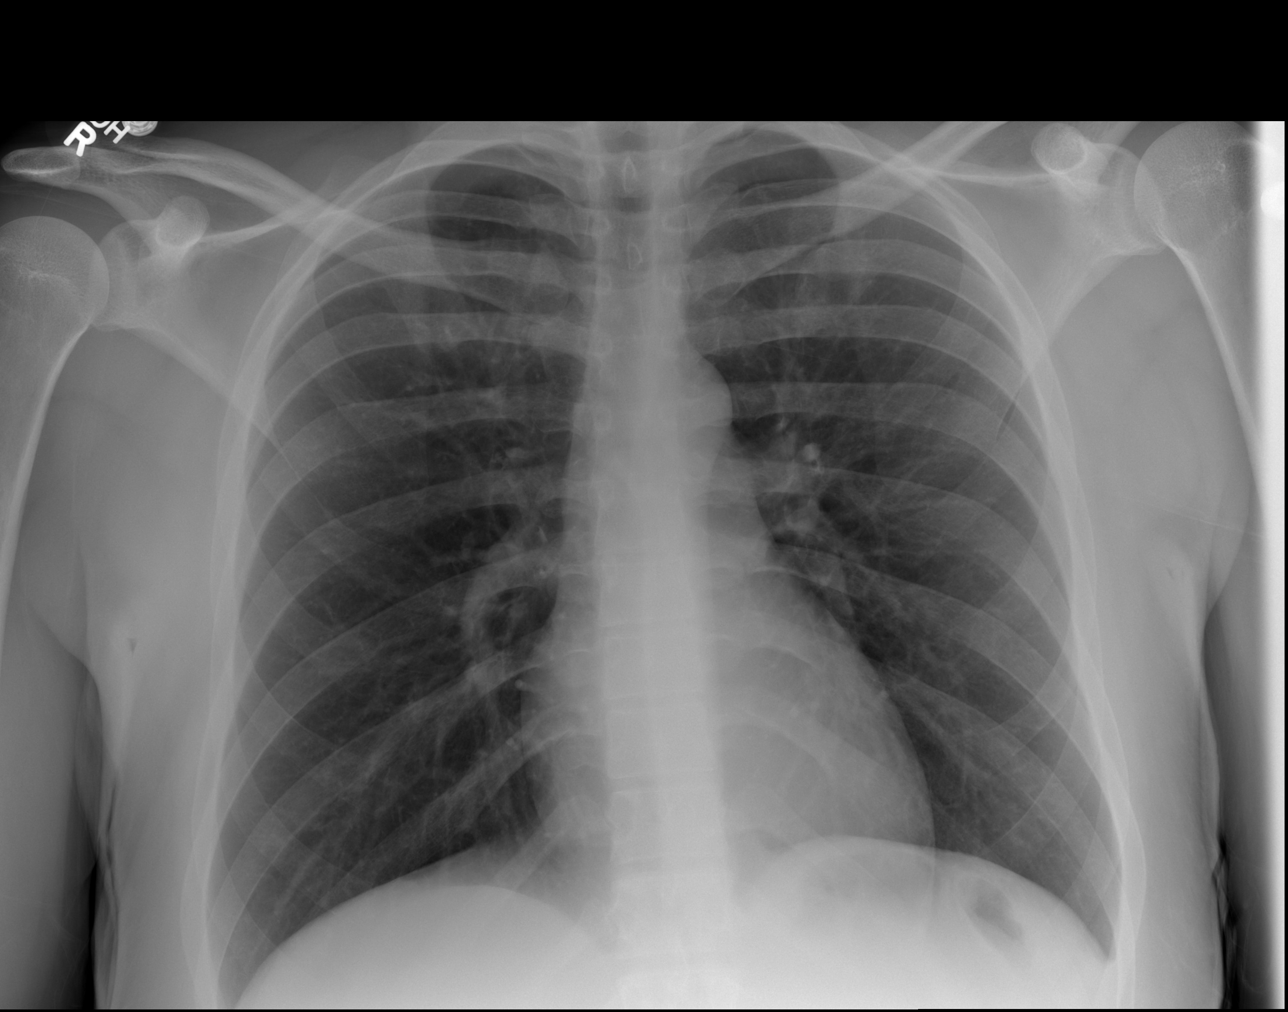

[w abdomen decub]
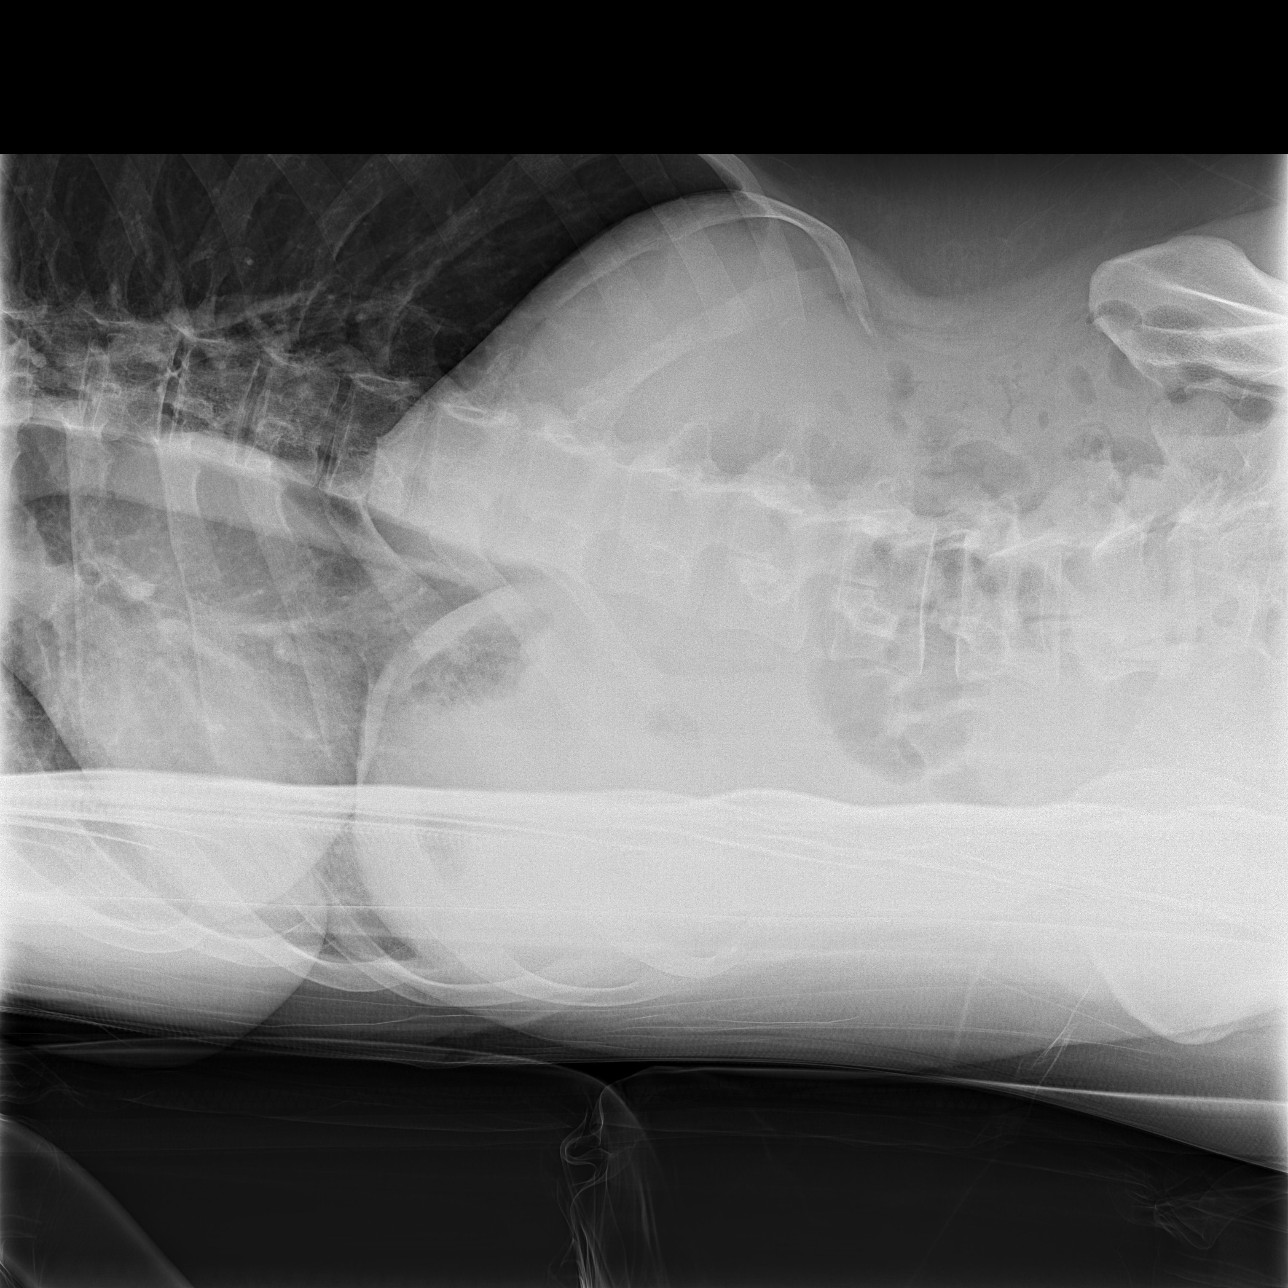

[x abdomen supine]
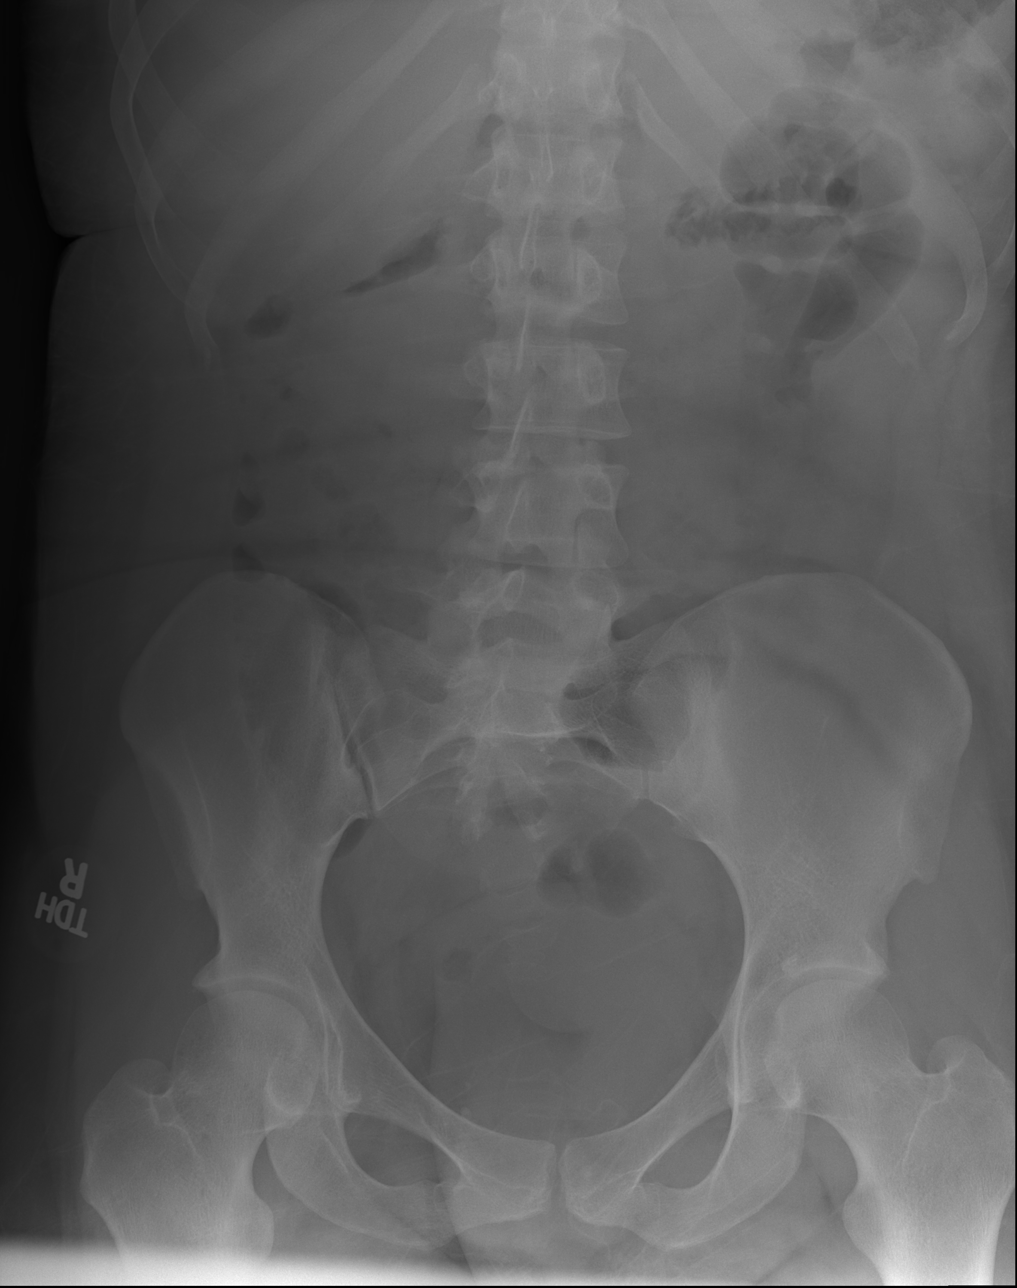

[3 of 3 positions shown; findings below may reference images not displayed]

FINDINGS: Lungs are clear. No pleural effusion or pneumothorax.

Cardiomediastinal silhouette is within normal limits.

Nonobstructive bowel gas pattern.

No evidence of free air under the diaphragm on the upright view.

Visualized osseous structures are within normal limits.
IMPRESSION: No evidence of acute cardiopulmonary disease.

No evidence of small bowel obstruction or free air.

## 2014-02-12 IMAGING — CR DG CHEST 2V
1 series · 3 of 3 positions shown · non-contrast
Comparison: none

REASON FOR EXAM: vomiting, shaking chills, recent surgery
COMMENTS:

PROCEDURE:     DXR - DXR CHEST PA (OR AP) AND LATERAL  - April 08, 2012  [DATE]
RESULT:     The lungs are well-expanded and clear. The cardiac silhouette is
normal in size. The mediastinum is normal in width. There is no pleural
effusion. The bony thorax exhibits no acute abnormality.

[Series 1: pa · 0.17mm/px · 3 of 3 slices shown]
[im 1/3]
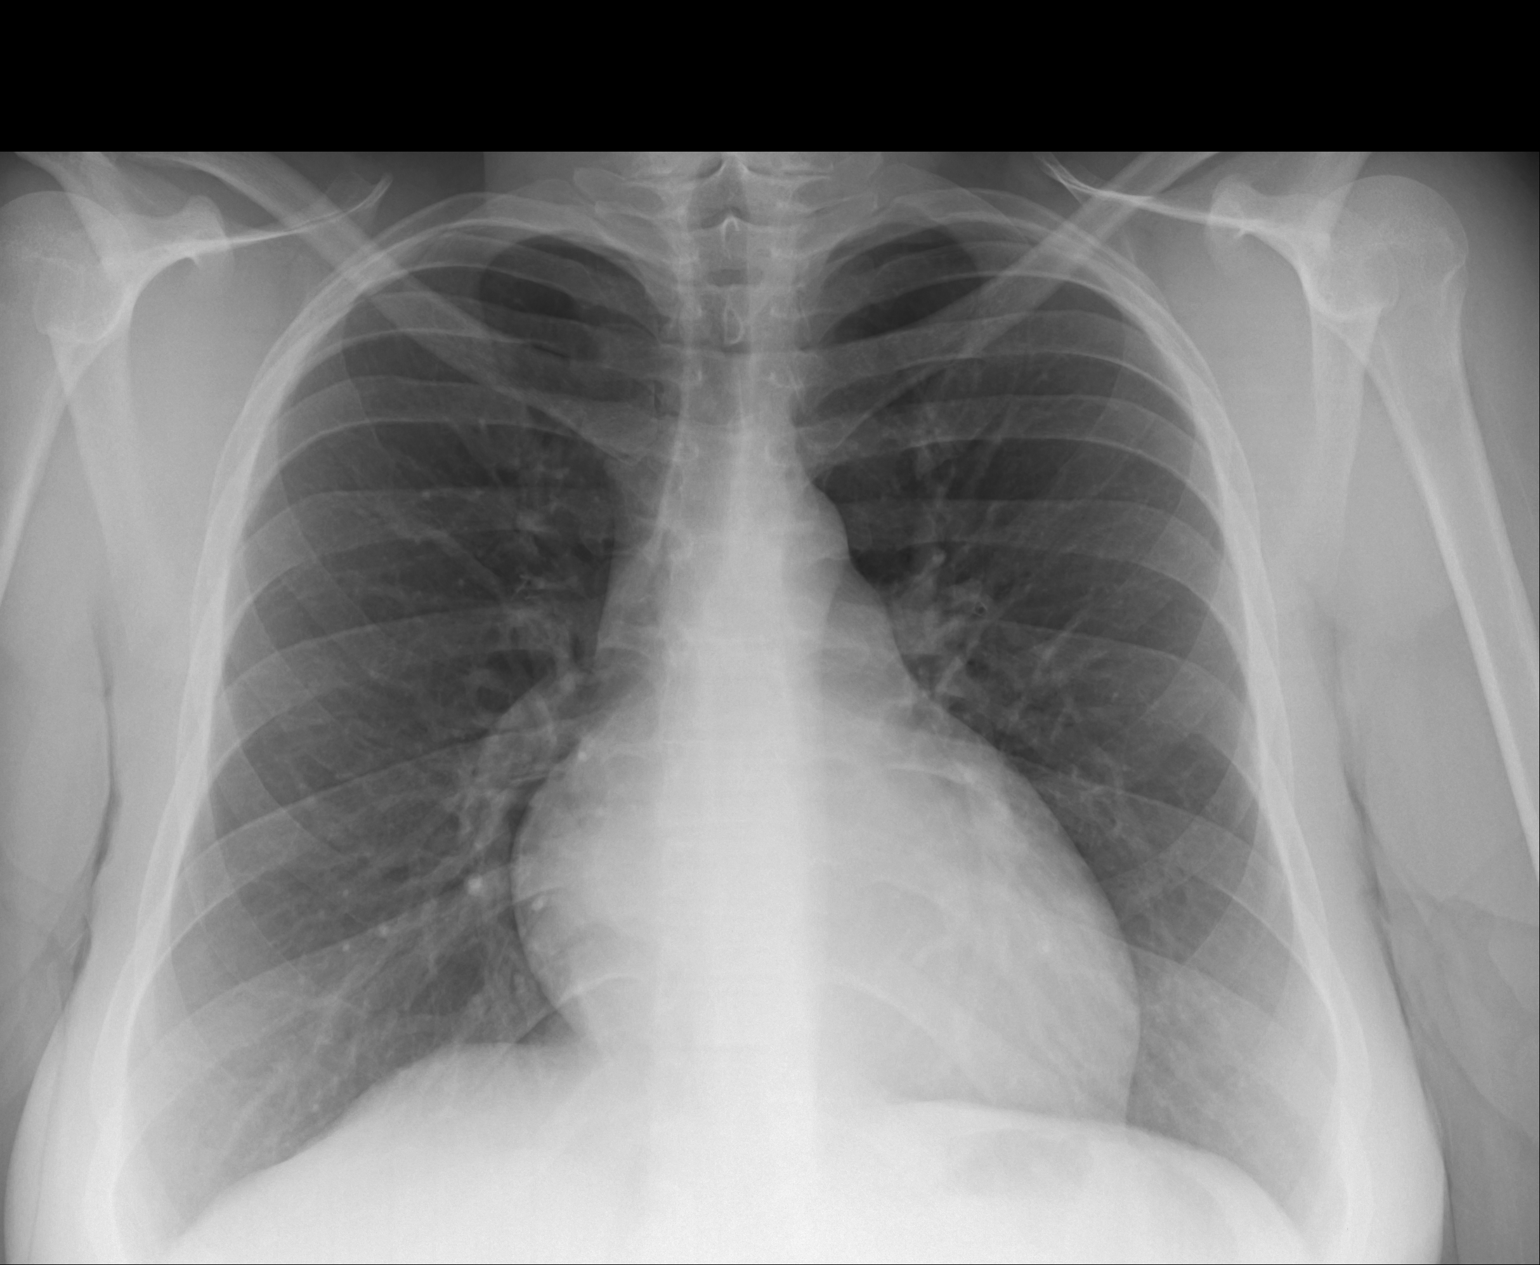
[im 2/3]
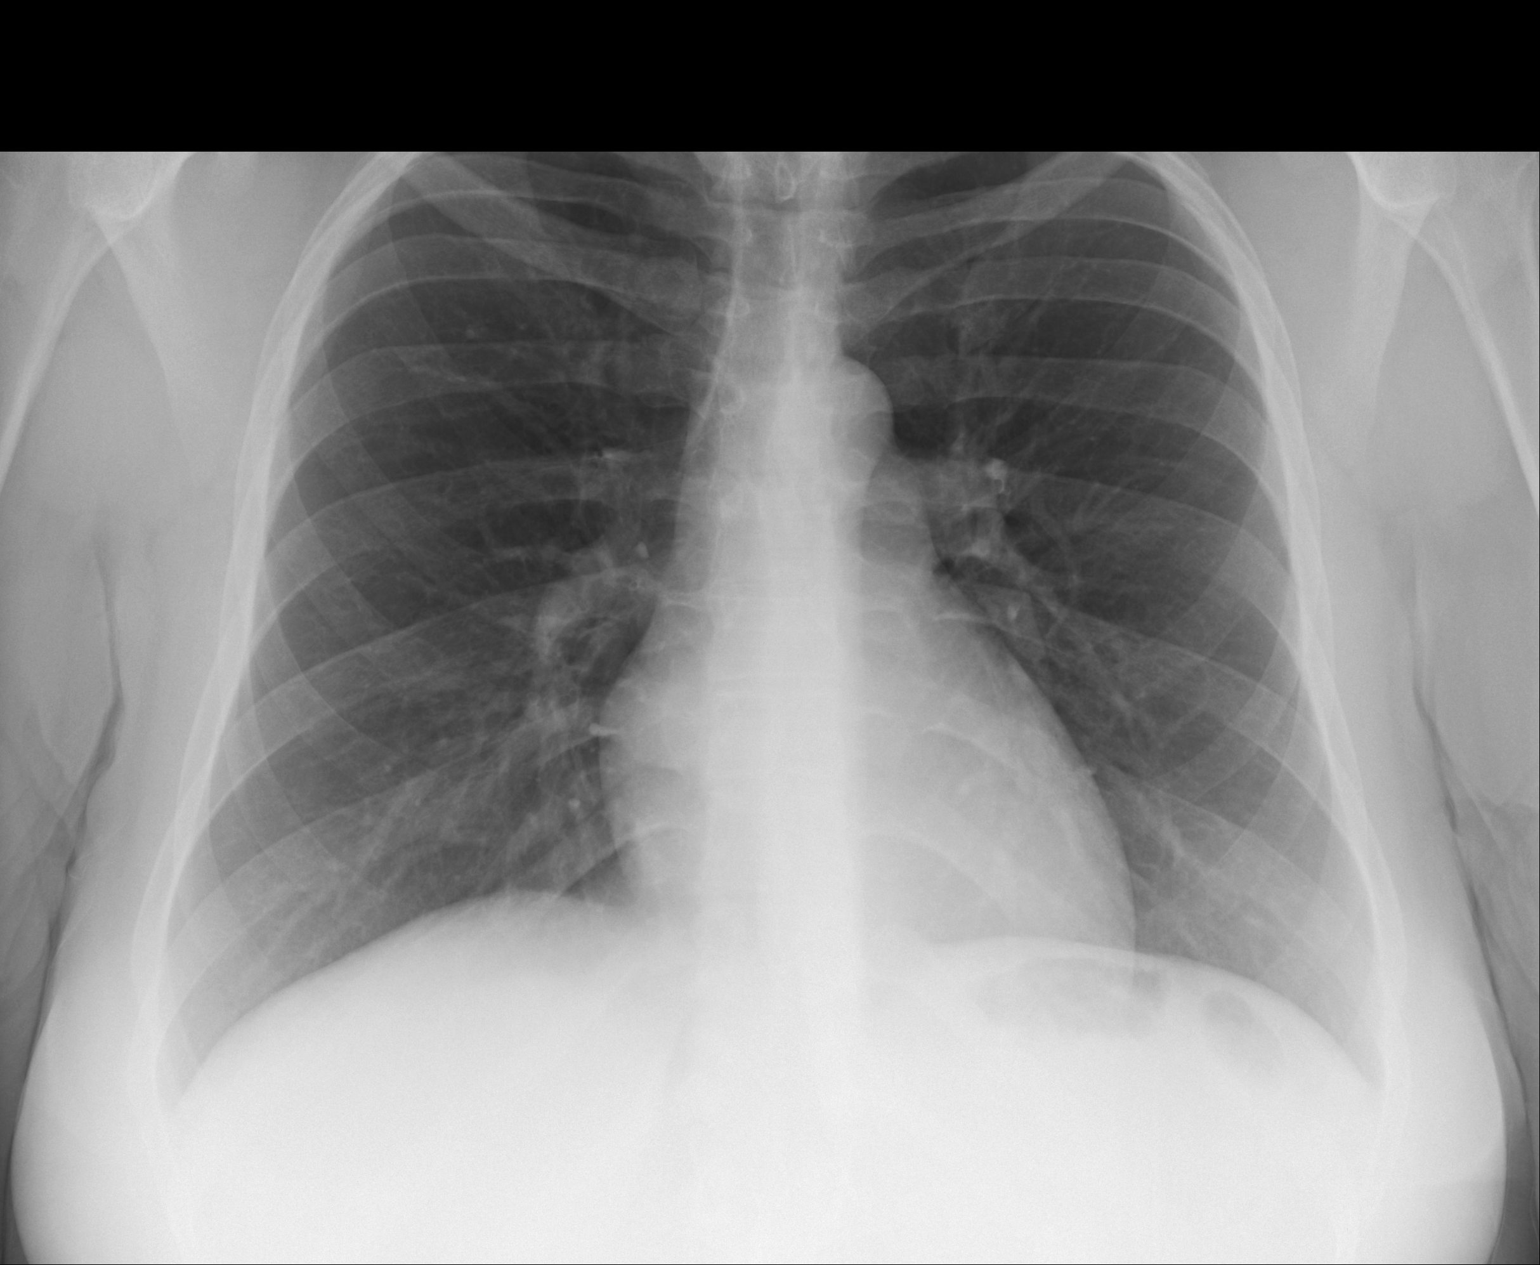
[im 3/3]
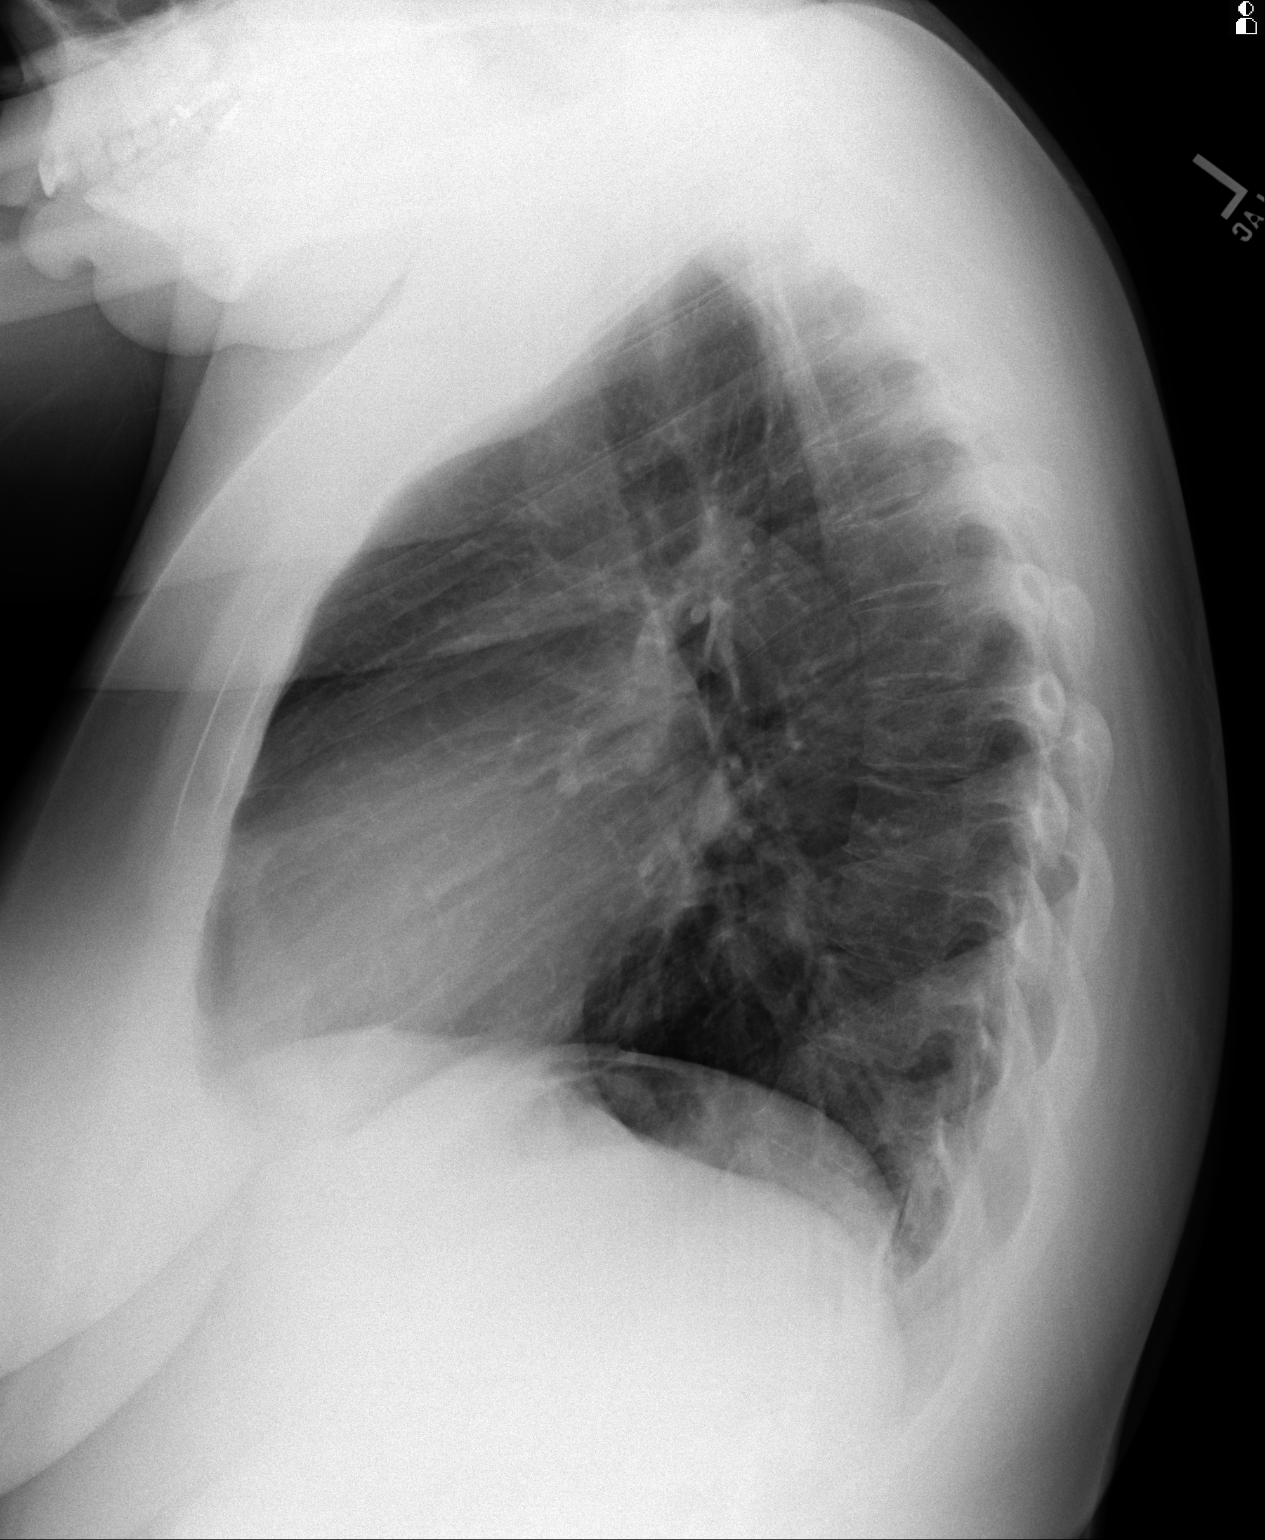

[3 of 3 positions shown; findings below may reference images not displayed]

IMPRESSION: There is no evidence of acute cardiopulmonary abnormality.

[REDACTED]

## 2014-02-12 IMAGING — US TRANSABDOMINAL ULTRASOUND OF PELVIS
1 series · 14 of 25 positions shown · non-contrast
Comparison: none

REASON FOR EXAM: pain and bleeding post delivery
COMMENTS:

[Series 1: transabdominal ultrasound of pelvis · 0.31mm/px · 14 of 43 slices shown]
[im 1/43]
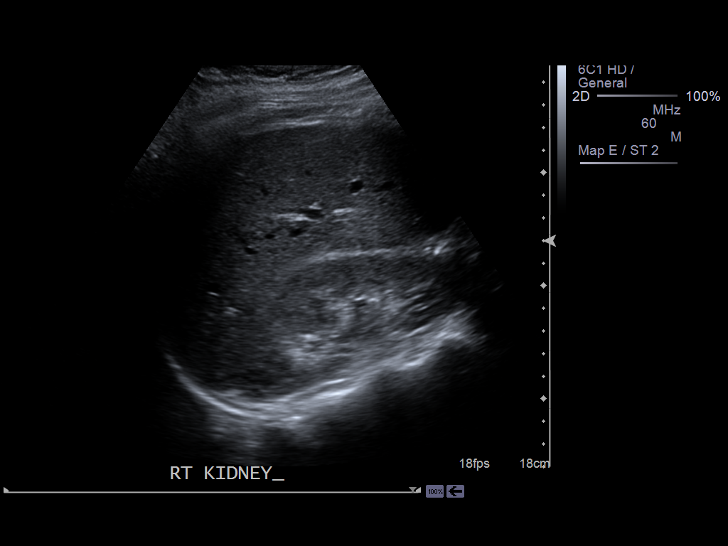
[im 4/43]
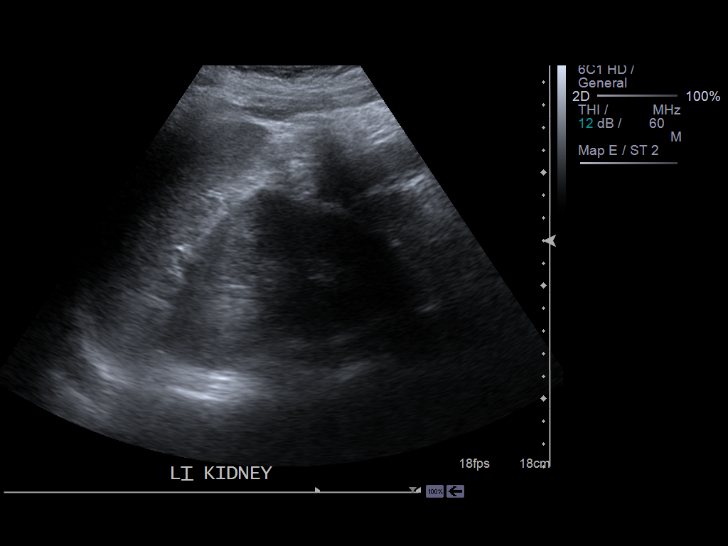
[im 8/43]
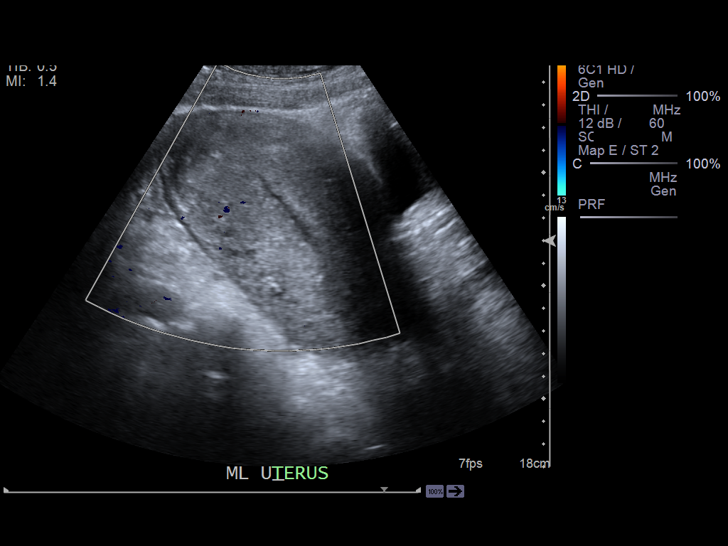
[im 11/43]
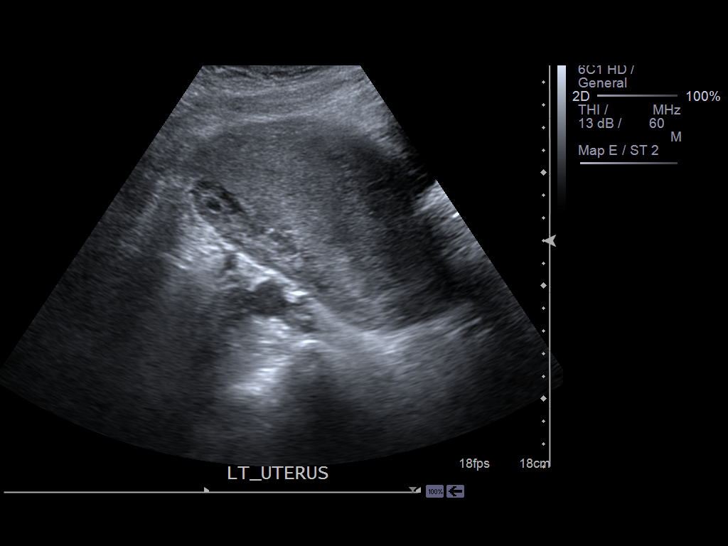
[im 15/43]
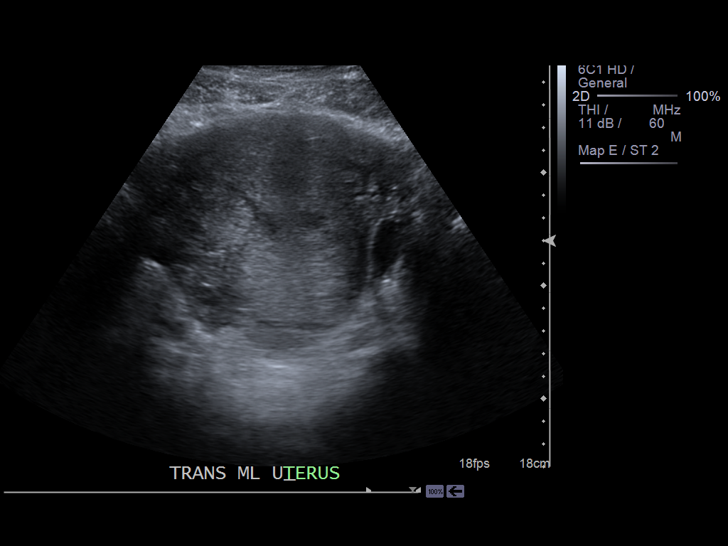
[im 16/43]
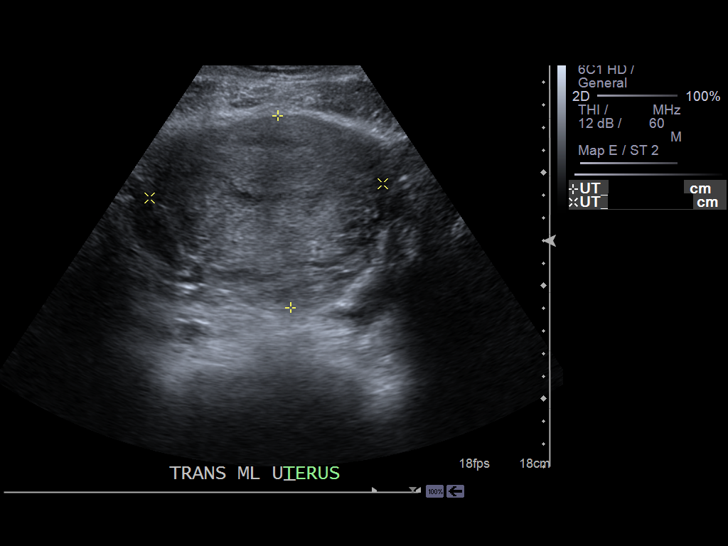
[im 20/43]
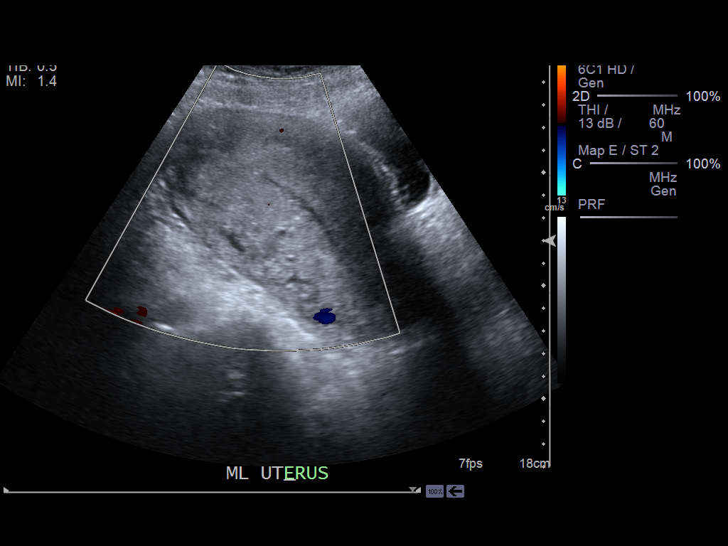
[im 23/43]
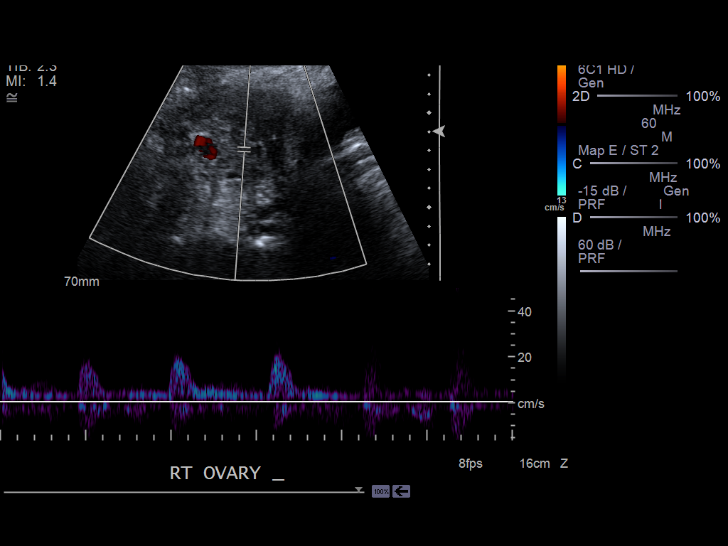
[im 27/43]
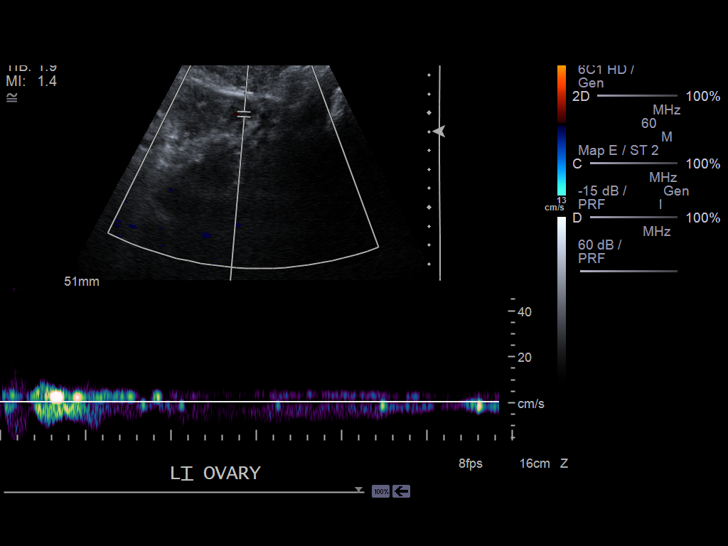
[im 29/43]
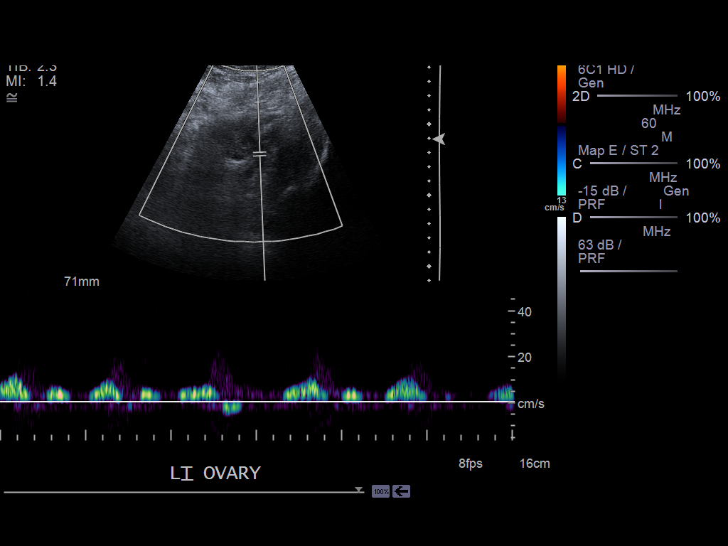
[im 32/43]
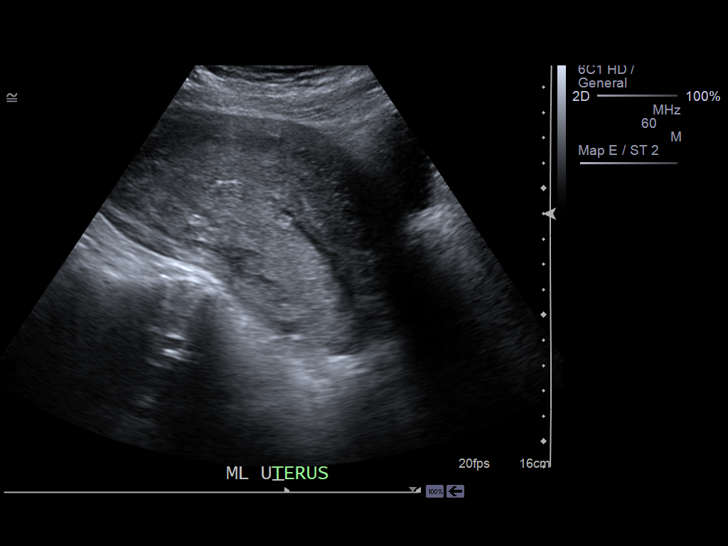
[im 36/43]
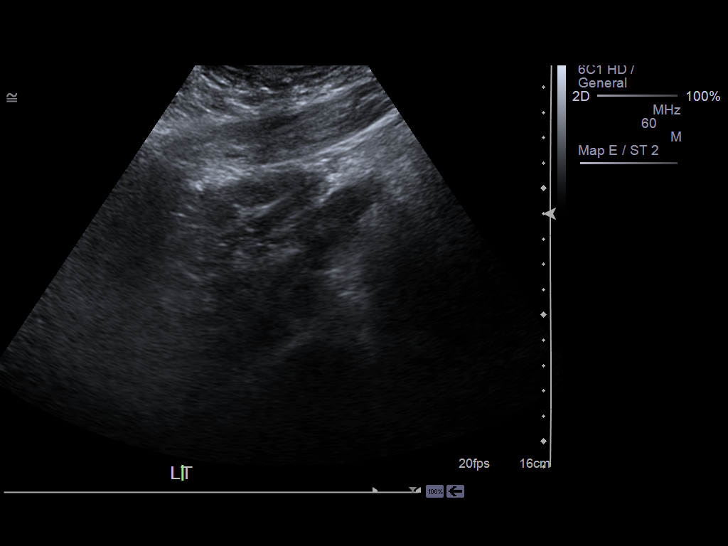
[im 39/43]
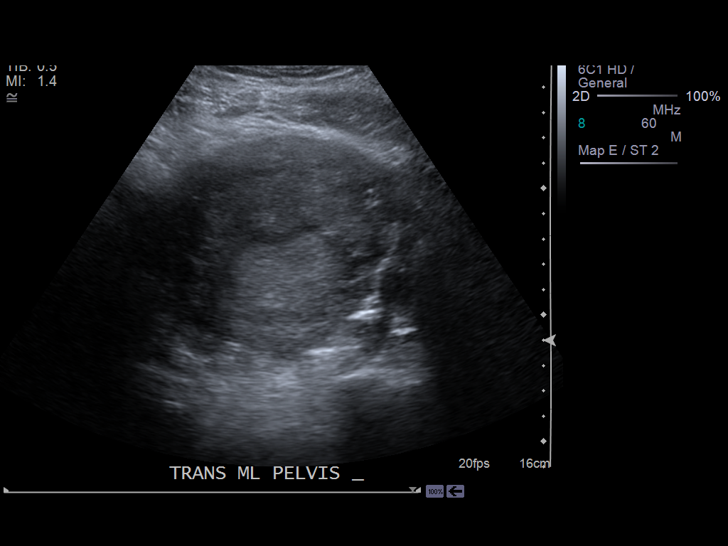
[im 43/43]
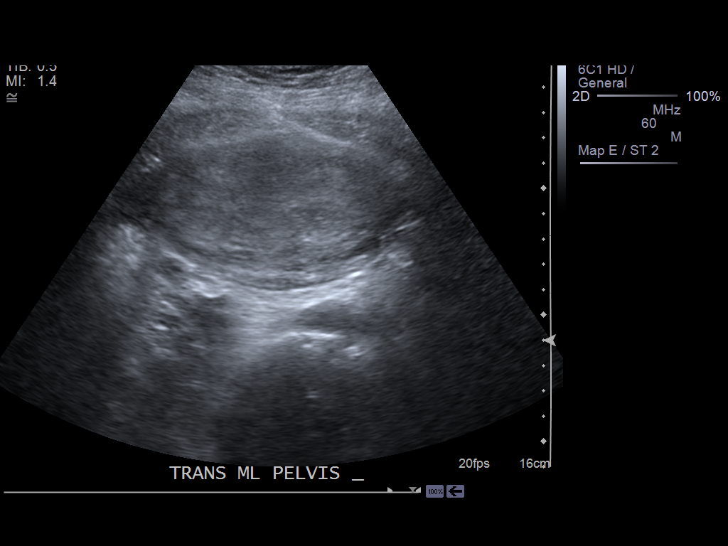

[14 of 25 positions shown; findings below may reference images not displayed]

PROCEDURE:     US  - US PELVIS EXAM  - April 08, 2012  [DATE]

RESULT:     The uterus is enlarged consistent with the postpartum state. It
measures 13.3 x 8.5 x 10.3 cm. The endometrial stripe measures approximately
5 mm in thickness and there is a small amount of endometrial fluid or blood
present. No retained products of conception are demonstrated. There is no
free fluid in the cul-de-sac.

The ovaries are normal in echotexture and vascularity. No cystic or solid
adnexal masses are demonstrated. The right ovary measures 2.2 x 2.4 x
cm. The left ovary measures 2.2 x 3.3 x 2.7 cm. There is no hydronephrosis.
IMPRESSION: 1. There are no findings to suggest retained products of conception. There
is a small amount of fluid within the endometrial cavity but the endometrial
stripe measures no more than 5 mm. The uterus remains mildly enlarged
consistent with the postpartum state.
2. The adnexal structures are normal in appearance.

[REDACTED]

## 2014-05-19 NOTE — Consult Note (Signed)
EGD with diffuse gastritis in lesser curvature with a sharp demarcation line, likely secondary to trauma of repeated vomiting.  Will start carafate slurry.  She had elevated BP during and after EGD, treated with small dose labetalol.  She may need BP med to go home on.  Recommend PPI and carafate when goes home.  Clear liquids and advance slowly to full liquids.  Electronic Signatures: Scot JunElliott, Robert T (MD)  (Signed on 14-Mar-14 15:21)  Authored  Last Updated: 14-Mar-14 15:21 by Scot JunElliott, Robert T (MD)

## 2014-05-19 NOTE — Consult Note (Signed)
PATIENT NAME:  Erin Pitts, Erin Pitts MR#:  [id number] DATE OF BIRTH:  February 10, 1980  DATE OF CONSULTATION:  04/08/2012  CONSULTATION REQUESTED BY:  Flint MelterLashawn A. Patton SallesWeaver-Lee, MD CONSULTING PHYSICIAN:  Herschell Dimesichard Pitts. Renae GlossWieting, MD  PRIMARY CARE PHYSICIAN:  None.   GYNECOLOGIST:  Lashawn A. Patton SallesWeaver-Lee, MD  CONSULTATION REQUESTED FOR:  Abdominal pain and colitis.   HISTORY OF PRESENT ILLNESS: This is a 34 year old female who underwent a vaginal delivery last week and then had a procedure done by Dr. Tiburcio PeaHarris, a postpartum bilateral tubal ligation. The patient was discharged from the hospital on April 04, 2012. The patient has had nausea and vomiting since then. She vomited up 30 times, unable to keep anything down, slight blood in the vomit, no diarrhea, severe abdominal pain, 8 out of 10 in intensity all over the abdomen, cramp-like in nature, nothing makes it better or worse. She has been having chills, no fever, positive for sweats. She has been having vaginal bleeding. She is still using 9 pads per day. In the ER, she had a normal white count. Urinalysis positive. CT scan of the abdomen and pelvis showed post surgical changes within the umbilical region, findings which may represent sequelae of Erin or mild colitis.   PAST MEDICAL HISTORY:  None.   PAST SURGICAL HISTORY:  Tubal ligation.   ALLERGIES:  No known drug allergies.   SOCIAL HISTORY:  An occasional cigarette. No alcohol. No drugs. Works as a Child psychotherapistwaitress.   FAMILY HISTORY:  Mother with hypertension, father unknown.   MEDICATIONS:  Include acetaminophen/oxycodone 325/5 mg 1 tablet 1 to 2 tablets every 3 hours as needed for pain, ferrous sulfate 325 mg daily, ibuprofen 800 mg every 8 hours p.r.n., Zofran 8 mg every 8 hours as needed for nausea or vomiting   ASSESSMENT AND PLAN:   1.  Abdominal pain with nausea, vomiting and vaginal bleeding. Since the patient did have a vaginal delivery and operation with tubal ligation last week, I did ask Dr. Patton SallesWeaver-Lee  to evaluate and she will be the admitting physician and we will be the consultant. We will get an ultrasound pelvic to see if there are any retained products that could be causing her to become very ill. The patient looks ill and uncomfortable more than her laboratory data and radiology data show. Gynecology will evaluate today. Since the CT scan did show a mild colitis, we will send off stool studies including stool for Clostridium difficile, empiric Levaquin and Flagyl at this point, p.r.n. nausea medications intravenously, p.r.n. pain medication intravenously.  2.  Hematemesis. Could be a Mallory-Weiss tear from persistent vomiting. We will stop ibuprofen at this point in time, give intravenous Protonix and get serial hemoglobins. Can consider gastroenterology consultation if does not settle down. We will keep n.p.o. at this point.  3.  Hypokalemia. We will replace potassium in the intravenous fluids and check magnesium in the a.m. Close clinical monitoring is needed at this point.   TIME SPENT ON CONSULTATION:  50 minutes.    ____________________________ Herschell Dimesichard Pitts. Renae GlossWieting, MD rjw:si D: 04/08/2012 14:39:03 ET T: 04/08/2012 15:28:28 ET JOB#: [id number]  cc: Herschell Dimesichard Pitts. Renae GlossWieting, MD, <Dictator> Lashawn A. Patton SallesWeaver-Lee, MD Salley ScarletICHARD Pitts Kendricks Reap MD ELECTRONICALLY SIGNED 04/08/2012 19:11

## 2014-05-19 NOTE — Consult Note (Signed)
Pt feeling much better, eating soft diet well.  I discussed with her soft diet, avoid NSAID meds.  it takes a few months to heal the gastritis.  She should be given antibiotics to cover and complete UTI treatment.  Electronic Signatures: Scot JunElliott, Remee Charley T (MD)  (Signed on 15-Mar-14 13:03)  Authored  Last Updated: 15-Mar-14 13:03 by Scot JunElliott, Jadi Deyarmin T (MD)

## 2014-05-19 NOTE — Consult Note (Signed)
PATIENT NAME:  GRETNA, BERGIN MR#:  [id number] DATE OF BIRTH:  [birth year]  DATE OF CONSULTATION:  04/08/2012  REFERRING PHYSICIAN:   CONSULTING PHYSICIAN:  Scot Jun, MD  HISTORY OF PRESENT ILLNESS: The patient is a 34 year old black female who recently had a vaginal delivery. She then had a bilateral tubal ligation. She then went home. She had had some vomiting before she went home. She had a lot more vomiting, not responding to medications and came back to the hospital and was admitted to the hospital. I was asked to see her in consultation.   The patient complains of epigastric pain for the last couple of days it gotten severe she says she put up a small amount of blood and her with fluid. Looks of large dark coffee-ground like material. The vomiting does not necessarily relieve her pain, except for just a few moments. She has had some chills and has a low-grade fever up to 100.    A CAT scan of the abdomen was done showing some mesenteric inflammation. She did have a tubal ligation recently. Also there is some mild thickening of the transverse and distal transverse colon   She says she has had spells of vomiting in the past and vomiting during her menstrual cycle. She would sometimes have to be hospitalized for severe vomiting related to her period.  This occurred much of her life, especially in her teens. She says she was sick her whole pregnancy, was in the hospital at least 3 times probably 5 days each.    She complains of constipation when she comes in the hospital and gets pain medication. She did not have a bowel movement for a week and had to dig the stool out with her fingers. When she is not in the hospital and not taking pain medicine, she does have constipation. She has run a low-grade temperature today and she has had chills. She denies any rectal bleeding.   HABITS: Does not smoke and does not drink, denies using street drugs.   FAMILY HISTORY: Grandfather with gallbladder  disease. No known ulcer disease in the family   ALLERGIES: No known drug allergies.    The patient was seen in consultation by Dr. Marva Panda in the past for vomiting associated with her periods.   PHYSICAL EXAMINATION: GENERAL: Black female.   She has vomited twice with clear stomach juice while I was in the room interviewing her. She had some chills afterwards.  T-max 100, temp now 989, blood pressure 154/89, pulse 65. Black female, looks to be not feeling well.  HEENT: Sclerae anicteric. Conjunctivae negative. Tongue negative.  HEAD: Atraumatic.  CHEST: Clear.  HEART: No murmurs or gallops.  ABDOMEN: Definite epigastric tenderness. No right upper quadrant tenderness, minimal lower abdominal tenderness. Bowel sounds are present.  SKIN: Warm and dry.   PSYCH: Affect and mood are appropriate for the condition.   LABORATORY DATA: Potassium 3.3. Lipase normal. Albumin 3. Other liver panel normal. White count 12.5 a week ago, today is 9, hemoglobin 11, hematocrit 33. Urinalysis shows 2+ leukocyte esterase, 15 white cells per high-powered field, 2+ bacteria   ASSESSMENT:  1.  Epigastric abdominal pain. There could be  inflammation in the stomach, she could have ulcers, she could have significant gastritis.  The act of  throwing up itself can cause gastric pathology with submucosal hemorrhages and start a vicious cycle. I think it is a good idea to take a look down her stomach tomorrow with a light and  see if any pathology exists in this area.  2.  We will consider changing antibiotics. Can exclude Flagyl, this has known a tendency to cause nausea and vomiting, even though it is a  very useful antibiotic. Consider switching to either Levaquin or Invanz or Zosyn.  3.  Await stool studies.  4.  She had findings suggesting inflammation in the mesentery and occasionally this can cause pain and vomiting and fever. Certainly, some of these changes could be due to recent bilateral tubal ligation,  but  bacterial infection is always a possibility and I would continue her antibiotics because of this finding.  The patient had blood cultures and these are pending. She has always had a tendency to have severe vomiting with her periods and during pregnancy, so this may be a continuation of her tendency to do that, in the face of a urinary tract infection and/or mesenteric infection.   I will follow with you.      ____________________________ Scot Junobert T. Elliott, MD rte:cc D: 04/08/2012 21:42:31 ET T: 04/08/2012 23:02:08 ET JOB#: [id number]  cc: Scot Junobert T. Elliott, MD, <Dictator> Lashawn A. Patton SallesWeaver-Lee, MD Herschell Dimesichard J. Renae GlossWieting, MD  Scot JunOBERT T ELLIOTT MD ELECTRONICALLY SIGNED 04/21/2012 14:58

## 2014-05-19 NOTE — Op Note (Signed)
PATIENT NAME:  Erin Pitts, [name] MR#:  [id number] DATE OF BIRTH:  01-21-1981  DATE OF PROCEDURE:  04/03/2012  PREOPERATIVE DIAGNOSIS: Desire for permanent sterility.  POSTOPERATIVE DIAGNOSIS: Desire for permanent sterility.  PROCEDURE: Postpartum bilateral tubal ligation.   SURGEON: Dierdre Searles. Paul Harris, MD  ANESTHESIA: General.   ESTIMATED BLOOD LOSS: Minimal.   COMPLICATIONS: None.   FINDINGS: Normal bilateral fallopian tubes with fimbria visualized on each side.   DISPOSITION: To recovery in stable condition.   TECHNIQUE: The patient is prepped and request in the usual sterile fashion. After adequate anesthesia is obtained in the supine position on the operating room table, Marcaine 0.5% used to anesthetize the skin just superior to the umbilicus. A scalpel is used to create a small skin incision. Dissected to the fascia which is tented anteriorly using a Kocher clamp. The fascia is incised and palpation and visualization reveals no bowel in this area. The patient is placed in Trendelenburg position and retractors are placed. The left and right fallopian tubes are identified out to the fimbria and then the midportion is tied with 2 Vicryl sutures, excised and cauterized. Excellent hemostasis is noted. The fascia is closed with 0 Vicryl suture and the skin is closed with Dermabond after a subcutaneous stitch is placed. A bandage is applied. The patient goes to the recovery room in stable condition. All sponge, instrument and needle counts are correct.   ____________________________ R. Annamarie MajorPaul Harris, MD rph:jm D: 04/03/2012 14:34:00 ET T: 04/03/2012 21:07:39 ET JOB#: [id number]  cc: Dierdre Searles. Paul Harris, MD, <Dictator> Nadara MustardOBERT P HARRIS MD ELECTRONICALLY SIGNED 04/05/2012 2:11

## 2014-05-19 NOTE — Consult Note (Signed)
Pt seen in consult.  Has severe vomiting, positive urine for UTI, epigastric pain not relieved by vomiting, recent ibuprofen use, non specific findings in colon, some stranding in mesenteric fat, recent BTL.   EGD tomorrow, rule out severe gastritis or PUD. also if possible stop flagyl which can cause signif nausea and vomiting and switch to Invanz or Zosyn to treat the UTI and possible infectious mesenteric condition, although could mesenteric changes can be due to recent BTL ????  Electronic Signatures: Scot JunElliott, Robert T (MD)  (Signed on 13-Mar-14 21:32)  Authored  Last Updated: 13-Mar-14 21:32 by Scot JunElliott, Robert T (MD)

## 2014-06-06 NOTE — H&P (Signed)
L&D Evaluation:  History:  HPI 34 year old G4 P1021with EDC=04/05/2012 by a 9wk1day ultrasound presents at 5835 5/7 weeks with c/o nausea and vomiting since 6 PM 03/06/2011. She has vomited >30 times today. Last vomited one hr PTA. Had 2 loose stools last night. Denies dysuria, fever, abdominal pain, ctxs, VB. +heartburn. Had two previous admissions to L&D for N/V in Nov and Dec 2013. A POS, VI, RNI. SVD in 2011 delivering a 6-6 female   Presents with nausea/vomiting   Patient's Medical History GERD/heartburn   Patient's Surgical History Surgery on left great toe, wisdom teeth extraction.   Medications Pre Natal Vitamins  Zantac 150 mgm BID   Allergies NKDA   Social History none   Family History Non-Contributory   ROS:  ROS see HPI   Exam:  Vital Signs stable   Urine Protein 1+, UA=2+ ketone, 2+leuks, 1 RBC, 11 WBC, 22sq cells.   General looks ill, threw up twice after arrival   Mental Status clear   Chest clear   Heart normal sinus rhythm, no murmur/gallop/rubs   Abdomen gravid, non-tender   Fetal Position vtx   Edema no edema   Reflexes 1+   Mebranes Intact   FHT normal rate with no decels   FHT Description Cat 1   Ucx rare   Skin dry   Impression:  Impression IUP at 35 5/7 weeks with N/V ?acute gastroenteritis   Plan:  Plan DC EFM. IV fluids. IV antiemetics. NPO x 4 hours then can try clear liquids. CBC and BMP.   Comments CBC:WBC=10.6, H&H=10.8/32.25, BMP remarkable for K+=3.3. will add 20 KCL to IV fluids.   Electronic Signatures: Trinna BalloonGutierrez, Mahamud Metts L (CNM)  (Signed 09-Feb-14 18:11)  Authored: L&D Evaluation   Last Updated: 09-Feb-14 18:11 by Trinna BalloonGutierrez, Amany Rando L (CNM)

## 2014-06-06 NOTE — H&P (Signed)
L&D Evaluation:  History Expanded:  HPI 34 year old G4 P1021with EDC=04/05/2012 by a 9wk1day ultrasound presents at 3139 1/7 weeks with c/o nausea this AM and pelvic pressure. Was seen in the office yesterday and was 3cm dilated.  Denies dysuria, fever, abdominal pain, regular ctxs, VB. +heartburn unresponsive to Zantac.  Had three previous admissions to L&D for N/V in Nov and Dec 2013 and Feb 2014. A POS, VI, RNI. SVD in 2011 delivering a 6-6 female, she qaws brought im by EMS and [facility] at 6 am and she is now 5 cm. she was 4 when admited by Dr Tiburcio PeaHarris this am.   Rhett BannisterGravida 4   Term 1   PreTerm 0   Abortion 2   Living 1   Blood Type (Maternal) A positive   Group B Strep Results Maternal (Result >5wks must be treated as unknown) negative   Maternal HIV Negative   Maternal Syphilis Ab Nonreactive   Maternal Varicella Immune   Rubella Results (Maternal) nonimmune   Maternal T-Dap Immune   [facility]   Presents with contractions, srom   Patient's Medical History GERD/heartburn   Patient's Surgical History Surgery on left great toe, wisdom teeth extraction.   Medications Pre Natal Vitamins  protonix 40 daily   Allergies NKDA   Social History none   Family History Non-Contributory   ROS:  ROS see HPI   General normal   HEENT normal   CNS normal   GI normal   GU 4/80 on 04/02/12   Resp normal   CV normal   Renal normal   Exam:  Vital Signs stable  127/72   Urine Protein not completed   General no apparent distress   Mental Status clear   Abdomen gravid, tender with contractions   Fetal Position cephalic   Pelvic no external lesions, 3/75%/-1   Mebranes Intact   FHT normal rate with no decels, 130s with accels to 150s to 160   FHT Description mod variability   Fetal Heart Rate 135   Ucx occasional   Skin dry   Impression:  Impression active labor, SROM   Plan:  Plan monitor contractions and for cervical change   Comments admit  and anticipate svd get epidural after cbc drawn   Follow Up Appointment in 6 weeks   Electronic Signatures: Adria DevonKlett, Carrie (MD)  (Signed 07-Mar-14 10:41)  Authored: L&D Evaluation   Last Updated: 07-Mar-14 10:41 by Adria DevonKlett, Carrie (MD)

## 2014-06-06 NOTE — H&P (Signed)
L&D Evaluation:  History:  HPI 34 year old G4 P1021with EDC=04/05/2012 by a 9wk1day ultrasound presents at 5039 1/7 weeks with c/o nausea this AM and pelvic pressure. Was seen in the office yesterday and was 3cm dilated.  Denies dysuria, fever, abdominal pain, regular ctxs, VB. +heartburn unresponsive to Zantac.  Had three previous admissions to L&D for N/V in Nov and Dec 2013 and Feb 2014. A POS, VI, RNI. SVD in 2011 delivering a 6-6 female   Presents with nausea and pelvic pressure   Patient's Medical History GERD/heartburn   Patient's Surgical History Surgery on left great toe, wisdom teeth extraction.   Medications Pre Natal Vitamins  Zantac 150 mgm BID   Allergies NKDA   Social History none   Family History Non-Contributory   ROS:  ROS see HPI   Exam:  Vital Signs stable  127/72   Urine Protein not completed   General no apparent distress, no episodes of vomiting at hospital   Mental Status clear   Abdomen gravid, non-tender   Fetal Position cephalic   Pelvic no external lesions, 3/75%/-1   Mebranes Intact   FHT normal rate with no decels, 130s with accels to 150s to 160   FHT Description mod variability   Fetal Heart Rate 135   Ucx occasional   Skin dry   Other able to tolerate clear liquids-nausea resolved by discharge   Impression:  Impression IUP at 39 1/7 weeks with reactive NST. Not in labor. Resolved nausea. Heartburn unresponsive to Zantac.   Plan:  Plan Change from Zantac to Protonix 40 mgm daily.  Labor precautions[facility].   Electronic Signatures: Trinna BalloonGutierrez, Ramses Klecka L (CNM)  (Signed 04-Mar-14 17:41)  Authored: L&D Evaluation   Last Updated: 04-Mar-14 17:41 by Trinna BalloonGutierrez, Chibuike Fleek L (CNM)

## 2014-06-06 NOTE — H&P (Signed)
L&D Evaluation:  History Expanded:   HPI 34 yo who presetns to l and d with c/o nuasea and hyperemesis. she was seen in the office two days ago for the same complaint. she was told to take zantac but she never went to get her written rx filled. she has thrown up 25-30 times since sunday and she cant pee right now.    Gravida 4    Term 1    PreTerm 0    Abortion 2    Living 1    Blood Type (Maternal) A positive    Group B Strep Results Maternal (Result >5wks must be treated as unknown) unknown/result > 5 weeks ago    Maternal HIV Negative    Maternal Syphilis Ab Nonreactive    Maternal Varicella Immune    Rubella Results (Maternal) nonimmune    Maternal T-Dap Immune    [facility]    Presents with nausea/vomiting    Patient's Medical History No Chronic Illness    Patient's Surgical History none    Medications famotidine    Allergies NKDA    Social History tobacco  3 cigs a day    Family History Non-Contributory   ROS:   ROS All systems were reviewed.  HEENT, CNS, GI, GU, Respiratory, CV, Renal and Musculoskeletal systems were found to be normal.   Exam:   Vital Signs stable    Urine Protein not completed    General no apparent distress    Mental Status clear    Chest clear    Heart normal sinus rhythm    Abdomen gravid, non-tender    Edema no edema    Pelvic no external lesions    Mebranes Intact    FHT normal rate with no decels, reassuring buyt not reactive 25 weeks 2 days    Ucx absent    Skin dry   Impression:   Impression dehydration   Plan:   Plan fluids    Comments check urine and bmp    Follow Up Appointment need to schedule   Electronic Signatures: Adria DevonKlett, Anshul Meddings (MD)  (Signed 27-Nov-13 21:13)  Authored: L&D Evaluation   Last Updated: 27-Nov-13 21:13 by Adria DevonKlett, Sandy Haye (MD)

## 2014-06-06 NOTE — H&P (Signed)
L&D Evaluation:  History Expanded:   HPI 34 yo who presetns to l and d with c/o nuasea and hyperemesis. 5 x today she can not keep amnything down,  she was seen in the office a week ago for the same complaint. she was told to take zantac but she never went to get her written rx filled. last week she was seen in the hosptial on wed and given IVF for throwing up 25-30 times since sunday buit nio urine pronlems then and she was discag=rged ion zofran and phenergan, and she cant pee right now.    Gravida 4    Term 1    PreTerm 0    Abortion 2    Living 1    Blood Type (Maternal) A positive    Group B Strep Results Maternal (Result >5wks must be treated as unknown) unknown/result > 5 weeks ago    Maternal HIV Negative    Maternal Syphilis Ab Nonreactive    Maternal Varicella Immune    Rubella Results (Maternal) nonimmune    Maternal T-Dap Immune    EDC 06-Apr-2011    Presents with nausea/vomiting    Patient's Medical History No Chronic Illness    Patient's Surgical History none    Medications famotidine    Allergies NKDA    Social History tobacco  3 cigs a day    Family History Non-Contributory   ROS:    General vomiting    HEENT normal    CNS normal    GI nausea and vomiting    GU normal    Resp normal    CV normal    Renal normal    MS normal   Exam:   Vital Signs stable    Urine Protein not completed    General no apparent distress    Mental Status clear    Chest clear    Heart normal sinus rhythm    Abdomen gravid, non-tender    Edema no edema    Pelvic no external lesions    Mebranes Intact    FHT normal rate with no decels, reassuring buyt not reactive at 26 weeks,    Ucx absent    Skin dry   Impression:   Impression dehydration   Plan:   Plan fluids    Comments check urine and bmp    Follow Up Appointment need to schedule   Electronic Signatures: Adria DevonKlett, Josalynn Johndrow (MD)  (Signed 02-Dec-13 19:40)  Authored: L&D  Evaluation   Last Updated: 02-Dec-13 19:40 by Adria DevonKlett, Samantha Olivera (MD)

## 2015-07-15 ENCOUNTER — Other Ambulatory Visit: Payer: Self-pay

## 2015-07-15 ENCOUNTER — Encounter: Payer: Self-pay | Admitting: *Deleted

## 2015-07-15 ENCOUNTER — Emergency Department
Admission: EM | Admit: 2015-07-15 | Discharge: 2015-07-15 | Disposition: A | Discharge status: Home / Self Care | Payer: Medicaid Other | Attending: Emergency Medicine | Admitting: Emergency Medicine

## 2015-07-15 DIAGNOSIS — R1084 Generalized abdominal pain: Secondary | ICD-10-CM

## 2015-07-15 DIAGNOSIS — R112 Nausea with vomiting, unspecified: Secondary | ICD-10-CM | POA: Insufficient documentation

## 2015-07-15 DIAGNOSIS — R197 Diarrhea, unspecified: Secondary | ICD-10-CM | POA: Insufficient documentation

## 2015-07-15 DIAGNOSIS — F17210 Nicotine dependence, cigarettes, uncomplicated: Secondary | ICD-10-CM | POA: Insufficient documentation

## 2015-07-15 DIAGNOSIS — F1290 Cannabis use, unspecified, uncomplicated: Secondary | ICD-10-CM | POA: Insufficient documentation

## 2015-07-15 LAB — URINALYSIS COMPLETE WITH MICROSCOPIC (ARMC ONLY)
BILIRUBIN URINE: NEGATIVE
Bacteria, UA: NONE SEEN
Hgb urine dipstick: NEGATIVE
Leukocytes, UA: NEGATIVE
NITRITE: NEGATIVE
Protein, ur: 30 mg/dL — AB
SPECIFIC GRAVITY, URINE: 1.031 — AB (ref 1.005–1.030)
pH: 8 (ref 5.0–8.0)

## 2015-07-15 LAB — TROPONIN I: Troponin I: <0.03 ng/mL (ref <0.031)

## 2015-07-15 LAB — CBC
HCT: 39.7 % (ref 35.0–47.0)
HEMOGLOBIN: 13.5 g/dL (ref 12.0–16.0)
MCH: 31 pg (ref 26.0–34.0)
MCHC: 34.1 g/dL (ref 32.0–36.0)
MCV: 91.1 fL (ref 80.0–100.0)
PLATELETS: 187 10*3/uL (ref 150–440)
RBC: 4.35 MIL/uL (ref 3.80–5.20)
RDW: 12.6 % (ref 11.5–14.5)
WBC: 8.8 10*3/uL (ref 3.6–11.0)

## 2015-07-15 LAB — COMPREHENSIVE METABOLIC PANEL
ALBUMIN: 4.4 g/dL (ref 3.5–5.0)
ALK PHOS: 30 U/L — AB (ref 38–126)
ALT: 12 U/L — AB (ref 14–54)
ANION GAP: 9 (ref 5–15)
AST: 19 U/L (ref 15–41)
BILIRUBIN TOTAL: 0.6 mg/dL (ref 0.3–1.2)
BUN: 13 mg/dL (ref 6–20)
CALCIUM: 8.8 mg/dL — AB (ref 8.9–10.3)
CO2: 22 mmol/L (ref 22–32)
CREATININE: 0.67 mg/dL (ref 0.44–1.00)
Chloride: 109 mmol/L (ref 101–111)
GFR calc non Af Amer: >60 mL/min (ref >60)
GLUCOSE: 132 mg/dL — AB (ref 65–99)
Potassium: 3.4 mmol/L — ABNORMAL LOW (ref 3.5–5.1)
SODIUM: 140 mmol/L (ref 135–145)
TOTAL PROTEIN: 7.2 g/dL (ref 6.5–8.1)

## 2015-07-15 LAB — LIPASE, BLOOD: Lipase: 13 U/L (ref 11–51)

## 2015-07-15 MED ORDER — ONDANSETRON 4 MG PO TBDP: 4.0000 mg | ORAL_TABLET | Freq: Three times a day (TID) | ORAL | Status: DC | PRN

## 2015-07-15 MED ORDER — ONDANSETRON HCL 4 MG/2ML IJ SOLN
4.0000 mg | Freq: Once | INTRAMUSCULAR | Status: AC
  Administered 2015-07-15: 4 mg via INTRAVENOUS
  Filled 2015-07-15: qty 2

## 2015-07-15 MED ORDER — SODIUM CHLORIDE 0.9 % IV BOLUS (SEPSIS)
1000.0000 mL | Freq: Once | INTRAVENOUS | Status: AC
  Administered 2015-07-15: 1000 mL via INTRAVENOUS

## 2015-07-15 MED ORDER — DEXTROSE-NACL 5-0.45 % IV SOLN
INTRAVENOUS | Status: DC
Start: 2015-07-15 — End: 2015-07-15
  Administered 2015-07-15: 05:00:00 via INTRAVENOUS

## 2015-07-15 MED ORDER — PROCHLORPERAZINE EDISYLATE 5 MG/ML IJ SOLN
5.0000 mg | Freq: Once | INTRAMUSCULAR | Status: AC
  Administered 2015-07-15: 5 mg via INTRAMUSCULAR
  Filled 2015-07-15: qty 2

## 2015-07-15 MED ORDER — ONDANSETRON HCL 4 MG/2ML IJ SOLN
4.0000 mg | Freq: Once | INTRAMUSCULAR | Status: AC | PRN
  Administered 2015-07-15: 4 mg via INTRAVENOUS
  Filled 2015-07-15: qty 2

## 2015-07-15 MED ORDER — DICYCLOMINE HCL 20 MG PO TABS: 20.0000 mg | ORAL_TABLET | Freq: Four times a day (QID) | ORAL | Status: DC | PRN

## 2015-07-15 MED ORDER — DIPHENHYDRAMINE HCL 50 MG/ML IJ SOLN
12.5000 mg | Freq: Once | INTRAMUSCULAR | Status: AC
  Administered 2015-07-15: 12.5 mg via INTRAVENOUS
  Filled 2015-07-15: qty 1

## 2015-07-15 NOTE — ED Notes (Signed)
Pt resting, states improved nausea. No emesis since benadryl and compazine administration.

## 2015-07-15 NOTE — ED Provider Notes (Signed)
Filed Vitals:   07/15/15 0530 07/15/15 0600  BP: 116/64 121/77  Pulse: 62 57  Temp:    Resp: 15 15     Patient reports that she feels well at this time. She's been able to tolerate by mouth without difficulty. Resting comfortably. The patient has passed a by mouth challenge reports symptoms much better. Patient being discharged, return precautions and prescriptions given. Return precautions and treatment recommendations and follow-up discussed with the patient who is agreeable with the plan.    Sharyn CreamerMark Quale, MD 07/15/15 [phone]

## 2015-07-15 NOTE — ED Notes (Signed)
Pt reports improved nausea.  

## 2015-07-15 NOTE — ED Notes (Signed)
Warm blanket provided. Call bell at right side.

## 2015-07-15 NOTE — ED Notes (Signed)
Pt presents w/ c/o vomiting since Sat. Morning. Pt is presently retching in triage. Pt c/o watery diarrhea x 1. Pt c/o chills and rigors but is presently afebrile. Pt denies taking any meds and states she feels weak and is unable to keep fluids down.

## 2015-07-15 NOTE — ED Notes (Signed)
Pt on enteric precautions. Call bell at right side. Warm blankets provided.

## 2015-07-15 NOTE — ED Notes (Signed)
Pt continues to have nausea and emesis. md notified, order for additional zofran received. Venipuncture x2 unsuccessful by ed rn x2. Ed tech in to attempt venipunture.

## 2015-07-15 NOTE — ED Notes (Signed)
Report to shannin.

## 2015-07-15 NOTE — Discharge Instructions (Signed)
1. You may take medicines as needed for nausea and abdominal cramps (Bentyl/Zofran #20). 2. Clear liquids 12 hours, then bland diet 3 days, then slowly advance diet as tolerated. 3. Return to the ER for worsening symptoms, persistent vomiting, to go to breathing or other concerns.  Nausea and Vomiting Nausea means you feel sick to your stomach. Throwing up (vomiting) is a reflex where stomach contents come out of your mouth. HOME CARE   Take medicine as told by your doctor.  Do not force yourself to eat. However, you do need to drink fluids.  If you feel like eating, eat a normal diet as told by your doctor.  Eat rice, wheat, potatoes, bread, lean meats, yogurt, fruits, and vegetables.  Avoid high-fat foods.  Drink enough fluids to keep your pee (urine) clear or pale yellow.  Ask your doctor how to replace body fluid losses (rehydrate). Signs of body fluid loss (dehydration) include:  Feeling very thirsty.  Dry lips and mouth.  Feeling dizzy.  Dark pee.  Peeing less than normal.  Feeling confused.  Fast breathing or heart rate. GET HELP RIGHT AWAY IF:   You have blood in your throw up.  You have black or bloody poop (stool).  You have a bad headache or stiff neck.  You feel confused.  You have bad belly (abdominal) pain.  You have chest pain or trouble breathing.  You do not pee at least once every 8 hours.  You have cold, clammy skin.  You keep throwing up after 24 to 48 hours.  You have a fever. MAKE SURE YOU:   Understand these instructions.  Will watch your condition.  Will get help right away if you are not doing well or get worse.   This information is not intended to replace advice given to you by your health care provider. Make sure you discuss any questions you have with your health care provider.   Document Released: 07/02/2007 Document Revised: 04/07/2011 Document Reviewed: 06/14/2010 Elsevier Interactive Patient Education 2016 Elsevier  Inc.  Diarrhea Diarrhea is watery poop (stool). It can make you feel weak, tired, thirsty, or give you a dry mouth (signs of dehydration). Watery poop is a sign of another problem, most often an infection. It often lasts 2-3 days. It can last longer if it is a sign of something serious. Take care of yourself as told by your doctor. HOME CARE   Drink 1 cup (8 ounces) of fluid each time you have watery poop.  Do not drink the following fluids:  Those that contain simple sugars (fructose, glucose, galactose, lactose, sucrose, maltose).  Sports drinks.  Fruit juices.  Whole milk products.  Sodas.  Drinks with caffeine (coffee, tea, soda) or alcohol.  Oral rehydration solution may be used if the doctor says it is okay. You may make your own solution. Follow this recipe:   - teaspoon table salt.   teaspoon baking soda.   teaspoon salt substitute containing potassium chloride.  1 tablespoons sugar.  1 liter (34 ounces) of water.  Avoid the following foods:  High fiber foods, such as raw fruits and vegetables.  Nuts, seeds, and whole grain breads and cereals.   Those that are sweetened with sugar alcohols (xylitol, sorbitol, mannitol).  Try eating the following foods:  Starchy foods, such as rice, toast, pasta, low-sugar cereal, oatmeal, baked potatoes, crackers, and bagels.  Bananas.  Applesauce.  Eat probiotic-rich foods, such as yogurt and milk products that are fermented.  Wash your hands  well after each time you have watery poop.  Only take medicine as told by your doctor.  Take a warm bath to help lessen burning or pain from having watery poop. GET HELP RIGHT AWAY IF:   You cannot drink fluids without throwing up (vomiting).  You keep throwing up.  You have blood in your poop, or your poop looks black and tarry.  You do not pee (urinate) in 6-8 hours, or there is only a small amount of very dark pee.  You have belly (abdominal) pain that gets worse or  stays in the same spot (localizes).  You are weak, dizzy, confused, or light-headed.  You have a very bad headache.  Your watery poop gets worse or does not get better.  You have a fever or lasting symptoms for more than 2-3 days.  You have a fever and your symptoms suddenly get worse. MAKE SURE YOU:   Understand these instructions.  Will watch your condition.  Will get help right away if you are not doing well or get worse.   This information is not intended to replace advice given to you by your health care provider. Make sure you discuss any questions you have with your health care provider.   Document Released: 07/02/2007 Document Revised: 02/03/2014 Document Reviewed: 09/21/2011 Elsevier Interactive Patient Education 2016 Elsevier Inc.  Abdominal Pain, Adult Many things can cause belly (abdominal) pain. Most times, the belly pain is not dangerous. Many cases of belly pain can be watched and treated at home. HOME CARE   Do not take medicines that help you go poop (laxatives) unless told to by your doctor.  Only take medicine as told by your doctor.  Eat or drink as told by your doctor. Your doctor will tell you if you should be on a special diet. GET HELP IF:  You do not know what is causing your belly pain.  You have belly pain while you are sick to your stomach (nauseous) or have runny poop (diarrhea).  You have pain while you pee or poop.  Your belly pain wakes you up at night.  You have belly pain that gets worse or better when you eat.  You have belly pain that gets worse when you eat fatty foods.  You have a fever. GET HELP RIGHT AWAY IF:   The pain does not go away within 2 hours.  You keep throwing up (vomiting).  The pain changes and is only in the right or left part of the belly.  You have bloody or tarry looking poop. MAKE SURE YOU:   Understand these instructions.  Will watch your condition.  Will get help right away if you are not doing  well or get worse.   This information is not intended to replace advice given to you by your health care provider. Make sure you discuss any questions you have with your health care provider.   Document Released: 07/02/2007 Document Revised: 02/03/2014 Document Reviewed: 09/22/2012 Elsevier Interactive Patient Education 2016 [employer].  Food Choices to Help Relieve Diarrhea, Adult When you have diarrhea, the foods you eat and your eating habits are very important. Choosing the right foods and drinks can help relieve diarrhea. Also, because diarrhea can last up to 7 days, you need to replace lost fluids and electrolytes (such as sodium, potassium, and chloride) in order to help prevent dehydration.  WHAT GENERAL GUIDELINES DO I NEED TO FOLLOW?  Slowly drink 1 cup (8 oz) of fluid for each episode  of diarrhea. If you are getting enough fluid, your urine will be clear or pale yellow.  Eat starchy foods. Some good choices include white rice, white toast, pasta, low-fiber cereal, baked potatoes (without the skin), saltine crackers, and bagels.  Avoid large servings of any cooked vegetables.  Limit fruit to two servings per day. A serving is  cup or 1 small piece.  Choose foods with less than 2 g of fiber per serving.  Limit fats to less than 8 tsp (38 g) per day.  Avoid fried foods.  Eat foods that have probiotics in them. Probiotics can be found in certain dairy products.  Avoid foods and beverages that may increase the speed at which food moves through the stomach and intestines (gastrointestinal tract). Things to avoid include:  High-fiber foods, such as dried fruit, raw fruits and vegetables, nuts, seeds, and whole grain foods.  Spicy foods and high-fat foods.  Foods and beverages sweetened with high-fructose corn syrup, honey, or sugar alcohols such as xylitol, sorbitol, and mannitol. WHAT FOODS ARE RECOMMENDED? Grains White rice. White, [place], or pita breads (fresh or  toasted), including plain rolls, buns, or bagels. White pasta. Saltine, soda, or graham crackers. Pretzels. Low-fiber cereal. Cooked cereals made with water (such as cornmeal, farina, or cream cereals). Plain muffins. Matzo. Melba toast. Zwieback.  Vegetables Potatoes (without the skin). Strained tomato and vegetable juices. Most well-cooked and canned vegetables without seeds. Tender lettuce. Fruits Cooked or canned applesauce, apricots, cherries, fruit cocktail, grapefruit, peaches, pears, or plums. Fresh bananas, apples without skin, cherries, grapes, cantaloupe, grapefruit, peaches, oranges, or plums.  Meat and Other Protein Products Baked or boiled chicken. Eggs. Tofu. Fish. Seafood. Smooth peanut butter. Ground or well-cooked tender beef, ham, veal, lamb, pork, or poultry.  Dairy Plain yogurt, kefir, and unsweetened liquid yogurt. Lactose-free milk, buttermilk, or soy milk. Plain hard cheese. Beverages Sport drinks. Clear broths. Diluted fruit juices (except prune). Regular, caffeine-free sodas such as ginger ale. Water. Decaffeinated teas. Oral rehydration solutions. Sugar-free beverages not sweetened with sugar alcohols. Other Bouillon, broth, or soups made from recommended foods.  The items listed above may not be a complete list of recommended foods or beverages. Contact your dietitian for more options. WHAT FOODS ARE NOT RECOMMENDED? Grains Whole grain, whole wheat, bran, or rye breads, rolls, pastas, crackers, and cereals. Wild or brown rice. Cereals that contain more than 2 g of fiber per serving. Corn tortillas or taco shells. Cooked or dry oatmeal. Granola. Popcorn. Vegetables Raw vegetables. Cabbage, broccoli, Brussels sprouts, artichokes, baked beans, beet greens, corn, kale, legumes, peas, sweet potatoes, and yams. Potato skins. Cooked spinach and cabbage. Fruits Dried fruit, including raisins and dates. Raw fruits. Stewed or dried prunes. Fresh apples with skin, apricots,  mangoes, pears, raspberries, and strawberries.  Meat and Other Protein Products Chunky peanut butter. Nuts and seeds. Beans and lentils. Tomasa BlaseBacon.  Dairy High-fat cheeses. Milk, chocolate milk, and beverages made with milk, such as milk shakes. Cream. Ice cream. Sweets and Desserts Sweet rolls, doughnuts, and sweet breads. Pancakes and waffles. Fats and Oils Butter. Cream sauces. Margarine. Salad oils. Plain salad dressings. Olives. Avocados.  Beverages Caffeinated beverages (such as coffee, tea, soda, or energy drinks). Alcoholic beverages. Fruit juices with pulp. Prune juice. Soft drinks sweetened with high-fructose corn syrup or sugar alcohols. Other Coconut. Hot sauce. Chili powder. Mayonnaise. Gravy. Cream-based or milk-based soups.  The items listed above may not be a complete list of foods and beverages to avoid. Contact your dietitian for more  information. WHAT SHOULD I DO IF I BECOME DEHYDRATED? Diarrhea can sometimes lead to dehydration. Signs of dehydration include dark urine and dry mouth and skin. If you think you are dehydrated, you should rehydrate with an oral rehydration solution. These solutions can be purchased at pharmacies, retail stores, or online.  Drink -1 cup (120-240 mL) of oral rehydration solution each time you have an episode of diarrhea. If drinking this amount makes your diarrhea worse, try drinking smaller amounts more often. For example, drink 1-3 tsp (5-15 mL) every 5-10 minutes.  A general rule for staying hydrated is to drink 1-2 L of fluid per day. Talk to your health care provider about the specific amount you should be drinking each day. Drink enough fluids to keep your urine clear or pale yellow.   This information is not intended to replace advice given to you by your health care provider. Make sure you discuss any questions you have with your health care provider.   Document Released: 04/05/2003 Document Revised: 02/03/2014 Document Reviewed:  12/06/2012 Elsevier Interactive Patient Education [employer]

## 2015-07-15 NOTE — ED Notes (Signed)
Patient states she was able to eat some food and has not vomited it back up or had further diarrhea.

## 2015-07-15 NOTE — ED Provider Notes (Signed)
[facility] Emergency Department Provider Note   ____________________________________________  Time seen: Approximately 4:04 AM  I have reviewed the triage vital signs and the nursing notes.   HISTORY  Chief Complaint Emesis    HPI Erin Pitts is a 35 y.o. female who presents to the ED from home with a chief complaint of nausea, vomiting and diarrhea. Patient reports she has had "over 40 episodes of vomiting" since yesterday morning. States she has had 1-2 watery diarrhea bowel movements since. Complains of chills and body aches. Denies associated chest pain, shortness of breath, dysuria. Denies recent travel, trauma or antibiotic use. Nothing makes her symptoms better or worse.   History reviewed. No pertinent past medical history.  Patient Active Problem List   Diagnosis Date Noted  . Hypokalemia 12/17/2010  . Abdominal pain, acute 12/14/2010  . Prolonged severe nausea and vomiting 12/14/2010    Past Surgical History  Procedure Laterality Date  . Tubal ligation      Current Outpatient Rx  Name  Route  Sig  Dispense  Refill  . ondansetron (ZOFRAN) 4 MG tablet   Oral   Take 4 mg by mouth every 6 (six) hours as needed. Reported on 07/15/2015         . EXPIRED: potassium chloride SA (K-DUR,KLOR-CON) 20 MEQ tablet   Oral   Take 1 tablet (20 mEq total) by mouth 2 (two) times daily.   60 tablet   1     Allergies Review of patient's allergies indicates no known allergies.  History reviewed. No pertinent family history.  Social History Social History  Substance Use Topics  . Smoking status: Current Every Day Smoker    Types: Cigarettes  . Smokeless tobacco: Never Used  . Alcohol Use: No    Review of Systems  Constitutional: No fever/chills. Eyes: No visual changes. ENT: No sore throat. Cardiovascular: Denies chest pain. Respiratory: Denies shortness of breath. Gastrointestinal: Positive for abdominal cramps, nausea,  vomiting and diarrhea.  No constipation. Genitourinary: Negative for dysuria. Musculoskeletal: Negative for back pain. Skin: Negative for rash. Neurological: Negative for headaches, focal weakness or numbness.  10-point ROS otherwise negative.  ____________________________________________   PHYSICAL EXAM:  VITAL SIGNS: ED Triage Vitals  Enc Vitals Group     BP 07/15/15 0238 116/80 mmHg     Pulse Rate 07/15/15 0238 63     Resp 07/15/15 0238 20     Temp 07/15/15 0238 99 F (37.2 C)     Temp Source 07/15/15 0238 Oral     SpO2 07/15/15 0238 100 %     Weight 07/15/15 0238 195 lb (88.451 kg)     Height 07/15/15 0238  (1.778 m)     Head Cir --      Peak Flow --      Pain Score 07/15/15 0239 9     Pain Loc --      Pain Edu? --      Excl. in GC? --     Constitutional: Alert and oriented. Well appearing and in mild acute distress. Eyes: Conjunctivae are normal. PERRL. EOMI. Head: Atraumatic. Nose: No congestion/rhinnorhea. Mouth/Throat: Mucous membranes are mildly dry.  Oropharynx non-erythematous. Neck: No stridor.   Cardiovascular: Normal rate, regular rhythm. Grossly normal heart sounds.  Good peripheral circulation. Respiratory: Normal respiratory effort.  No retractions. Lungs CTAB. Gastrointestinal: Soft and mildly tender to palpation diffusely without rebound or guarding. No distention. No abdominal bruits. No CVA tenderness. Musculoskeletal: No lower extremity tenderness  nor edema.  No joint effusions. Neurologic:  Normal speech and language. No gross focal neurologic deficits are appreciated. Skin:  Skin is warm, dry and intact. No rash noted. Psychiatric: Mood and affect are normal. Speech and behavior are normal.  ____________________________________________   LABS (all labs ordered are listed, but only abnormal results are displayed)  Labs Reviewed  COMPREHENSIVE METABOLIC PANEL - Abnormal; Notable for the following:    Potassium 3.4 (*)    Glucose, Bld  132 (*)    Calcium 8.8 (*)    ALT 12 (*)    Alkaline Phosphatase 30 (*)    All other components within normal limits  C DIFFICILE QUICK SCREEN W PCR REFLEX  GASTROINTESTINAL PANEL BY PCR, STOOL (REPLACES STOOL CULTURE)  LIPASE, BLOOD  CBC  TROPONIN I  URINALYSIS COMPLETEWITH MICROSCOPIC ([facility] ONLY)  POC URINE PREG, ED   ____________________________________________  EKG  ED ECG REPORT I, SUNG,JADE J, the attending physician, personally viewed and interpreted this ECG.   Date: 07/15/2015  EKG Time: 0302  Rate: 55  Rhythm: sinus bradycardia  Axis: WNL  Intervals:none  ST&T Change: Nonspecific  ____________________________________________  RADIOLOGY  None ____________________________________________   PROCEDURES  Procedure(s) performed: None  Critical Care performed: No  ____________________________________________   INITIAL IMPRESSION / ASSESSMENT AND PLAN / ED COURSE  Pertinent labs & imaging results that were available during my care of the patient were reviewed by me and considered in my medical decision making (see chart for details).  [age] year old female who presents for abdominal cramps, nausea, vomiting, and diarrhea. Will initiate IV fluid resuscitation, antiemetic and reassess.  ----------------------------------------- 6:13 AM on 07/15/2015 -----------------------------------------  Patient sleeping. Reports improvement in nausea. IV fluids infusing. Awaiting urine specimen.  ----------------------------------------- 7:08 AM on 07/15/2015 -----------------------------------------  Patient was able to provide urine specimen. Had a little nausea when she got up to use the restroom. Currently sleeping soundly. IV fluids infusing. Care transferred to Dr. Fanny BienQuale. Anticipate discharge home if patient is able to tolerate PO challenge. ____________________________________________   FINAL CLINICAL IMPRESSION(S) / ED DIAGNOSES  Final diagnoses:    Non-intractable vomiting with nausea, vomiting of unspecified type  Diarrhea, unspecified type  Generalized abdominal pain      NEW MEDICATIONS STARTED DURING THIS VISIT:  New Prescriptions   No medications on file     Note:  This document was prepared using Dragon voice recognition software and may include unintentional dictation errors.    Irean HongJade J Sung, MD 07/15/15 [phone]

## 2015-11-20 ENCOUNTER — Emergency Department
Admission: EM | Admit: 2015-11-20 | Discharge: 2015-11-20 | Disposition: A | Discharge status: Home / Self Care | Payer: Medicaid Other | Attending: Emergency Medicine | Admitting: Emergency Medicine

## 2015-11-20 ENCOUNTER — Emergency Department: Payer: Medicaid Other

## 2015-11-20 ENCOUNTER — Encounter: Payer: Self-pay | Admitting: Emergency Medicine

## 2015-11-20 DIAGNOSIS — S20211A Contusion of right front wall of thorax, initial encounter: Secondary | ICD-10-CM

## 2015-11-20 DIAGNOSIS — Y999 Unspecified external cause status: Secondary | ICD-10-CM | POA: Insufficient documentation

## 2015-11-20 DIAGNOSIS — Y9389 Activity, other specified: Secondary | ICD-10-CM | POA: Insufficient documentation

## 2015-11-20 DIAGNOSIS — Z79899 Other long term (current) drug therapy: Secondary | ICD-10-CM | POA: Insufficient documentation

## 2015-11-20 DIAGNOSIS — F17210 Nicotine dependence, cigarettes, uncomplicated: Secondary | ICD-10-CM | POA: Insufficient documentation

## 2015-11-20 DIAGNOSIS — Y929 Unspecified place or not applicable: Secondary | ICD-10-CM | POA: Insufficient documentation

## 2015-11-20 LAB — POCT PREGNANCY, URINE: PREG TEST UR: NEGATIVE

## 2015-11-20 MED ORDER — NAPROXEN 500 MG PO TBEC: 500.0000 mg | DELAYED_RELEASE_TABLET | Freq: Two times a day (BID) | ORAL | 0 refills | Status: DC

## 2015-11-20 MED ORDER — CYCLOBENZAPRINE HCL 5 MG PO TABS: 5.0000 mg | ORAL_TABLET | Freq: Three times a day (TID) | ORAL | 0 refills | Status: DC | PRN

## 2015-11-20 MED ORDER — NAPROXEN 500 MG PO TABS
500.0000 mg | ORAL_TABLET | Freq: Once | ORAL | Status: AC
  Administered 2015-11-20: 500 mg via ORAL
  Filled 2015-11-20: qty 1
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
## 2015-11-20 NOTE — Discharge Instructions (Signed)
Take the prescription meds as directed. Apply cool or warm-moist compresses to reduce pain. Follow-up with a provider at one of the local community clinics for routine care.

## 2015-11-20 NOTE — ED Notes (Signed)
See triage note..the patient c/o L lower rib pain. Sts it feels "like its catching" when she takes a deep breath. Pts resp even and unlabored.  Denies LOC.
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
## 2018-06-18 ENCOUNTER — Other Ambulatory Visit: Payer: Self-pay

## 2018-06-18 ENCOUNTER — Encounter: Payer: Self-pay | Admitting: Emergency Medicine

## 2018-06-18 DIAGNOSIS — F17210 Nicotine dependence, cigarettes, uncomplicated: Secondary | ICD-10-CM | POA: Insufficient documentation

## 2018-06-18 DIAGNOSIS — Z79899 Other long term (current) drug therapy: Secondary | ICD-10-CM | POA: Insufficient documentation

## 2018-06-18 DIAGNOSIS — R112 Nausea with vomiting, unspecified: Secondary | ICD-10-CM | POA: Insufficient documentation

## 2018-06-18 DIAGNOSIS — R109 Unspecified abdominal pain: Secondary | ICD-10-CM | POA: Insufficient documentation

## 2018-06-18 MED ORDER — ONDANSETRON 4 MG PO TBDP
4.0000 mg | ORAL_TABLET | Freq: Once | ORAL | Status: DC | PRN
  Filled 2018-06-18: qty 1

## 2018-06-18 MED ORDER — SODIUM CHLORIDE 0.9% FLUSH
3.0000 mL | Freq: Once | INTRAVENOUS | Status: AC
  Administered 2018-06-19: 3 mL via INTRAVENOUS

## 2018-06-18 MED ORDER — ONDANSETRON HCL 4 MG/2ML IJ SOLN
4.0000 mg | Freq: Once | INTRAMUSCULAR | Status: AC
  Administered 2018-06-19: 4 mg via INTRAVENOUS
  Filled 2018-06-18: qty 2

## 2018-06-18 NOTE — ED Triage Notes (Signed)
Patient c/o N/V X 8 hours, weakness, near syncope, and abdominal pain.

## 2018-06-19 ENCOUNTER — Emergency Department
Admission: EM | Admit: 2018-06-19 | Discharge: 2018-06-19 | Disposition: A | Discharge status: Home / Self Care | Payer: Self-pay | Attending: Emergency Medicine | Admitting: Emergency Medicine

## 2018-06-19 DIAGNOSIS — R112 Nausea with vomiting, unspecified: Secondary | ICD-10-CM

## 2018-06-19 LAB — COMPREHENSIVE METABOLIC PANEL
ALT: 12 U/L (ref 0–44)
AST: 18 U/L (ref 15–41)
Albumin: 4.9 g/dL (ref 3.5–5.0)
Alkaline Phosphatase: 37 U/L — ABNORMAL LOW (ref 38–126)
Anion gap: 13 (ref 5–15)
BUN: 12 mg/dL (ref 6–20)
CO2: 25 mmol/L (ref 22–32)
Calcium: 9.6 mg/dL (ref 8.9–10.3)
Chloride: 102 mmol/L (ref 98–111)
Creatinine, Ser: 0.56 mg/dL (ref 0.44–1.00)
GFR calc Af Amer: >60 mL/min (ref >60)
GFR calc non Af Amer: >60 mL/min (ref >60)
Glucose, Bld: 164 mg/dL — ABNORMAL HIGH (ref 70–99)
Potassium: 3.2 mmol/L — ABNORMAL LOW (ref 3.5–5.1)
Sodium: 140 mmol/L (ref 135–145)
Total Bilirubin: 0.5 mg/dL (ref 0.3–1.2)
Total Protein: 8.3 g/dL — ABNORMAL HIGH (ref 6.5–8.1)

## 2018-06-19 LAB — CBC
HCT: 42.6 % (ref 36.0–46.0)
Hemoglobin: 14.6 g/dL (ref 12.0–15.0)
MCH: 30.7 pg (ref 26.0–34.0)
MCHC: 34.3 g/dL (ref 30.0–36.0)
MCV: 89.5 fL (ref 80.0–100.0)
Platelets: 282 10*3/uL (ref 150–400)
RBC: 4.76 MIL/uL (ref 3.87–5.11)
RDW: 12.7 % (ref 11.5–15.5)
WBC: 7.7 10*3/uL (ref 4.0–10.5)
nRBC: 0 % (ref 0.0–0.2)

## 2018-06-19 LAB — URINALYSIS, COMPLETE (UACMP) WITH MICROSCOPIC
Bacteria, UA: NONE SEEN
Bilirubin Urine: NEGATIVE
Glucose, UA: 50 mg/dL — AB
Hgb urine dipstick: NEGATIVE
Ketones, ur: 20 mg/dL — AB
Leukocytes,Ua: NEGATIVE
Nitrite: NEGATIVE
Protein, ur: >=300 mg/dL — AB
Specific Gravity, Urine: 1.027 (ref 1.005–1.030)
pH: 9 — ABNORMAL HIGH (ref 5.0–8.0)

## 2018-06-19 LAB — URINE DRUG SCREEN, QUALITATIVE (ARMC ONLY)
Amphetamines, Ur Screen: NOT DETECTED
Barbiturates, Ur Screen: NOT DETECTED
Benzodiazepine, Ur Scrn: NOT DETECTED
Cannabinoid 50 Ng, Ur ~~LOC~~: POSITIVE — AB
Cocaine Metabolite,Ur ~~LOC~~: NOT DETECTED
MDMA (Ecstasy)Ur Screen: NOT DETECTED
Methadone Scn, Ur: NOT DETECTED
Opiate, Ur Screen: POSITIVE — AB
Phencyclidine (PCP) Ur S: NOT DETECTED
Tricyclic, Ur Screen: NOT DETECTED

## 2018-06-19 LAB — POCT PREGNANCY, URINE: Preg Test, Ur: NEGATIVE

## 2018-06-19 LAB — LIPASE, BLOOD: Lipase: 28 U/L (ref 11–51)

## 2018-06-19 LAB — MAGNESIUM: Magnesium: 1.9 mg/dL (ref 1.7–2.4)

## 2018-06-19 MED ORDER — MORPHINE SULFATE (PF) 4 MG/ML IV SOLN
INTRAVENOUS | Status: AC
  Administered 2018-06-19: 4 mg via INTRAVENOUS
  Filled 2018-06-19: qty 1

## 2018-06-19 MED ORDER — SODIUM CHLORIDE 0.9 % IV BOLUS
1000.0000 mL | Freq: Once | INTRAVENOUS | Status: AC
  Administered 2018-06-19: 1000 mL via INTRAVENOUS

## 2018-06-19 MED ORDER — DIPHENHYDRAMINE HCL 50 MG/ML IJ SOLN
12.5000 mg | INTRAMUSCULAR | Status: AC
  Administered 2018-06-19: 12.5 mg via INTRAVENOUS
  Filled 2018-06-19: qty 1

## 2018-06-19 MED ORDER — ONDANSETRON HCL 4 MG/2ML IJ SOLN
4.0000 mg | Freq: Once | INTRAMUSCULAR | Status: AC
  Administered 2018-06-19: 01:00:00 4 mg via INTRAVENOUS

## 2018-06-19 MED ORDER — HALOPERIDOL LACTATE 5 MG/ML IJ SOLN
5.0000 mg | Freq: Once | INTRAMUSCULAR | Status: AC
  Administered 2018-06-19: 5 mg via INTRAVENOUS
  Filled 2018-06-19: qty 1

## 2018-06-19 MED ORDER — ONDANSETRON 4 MG PO TBDP: ORAL_TABLET | ORAL | 0 refills | Status: DC

## 2018-06-19 MED ORDER — ONDANSETRON HCL 4 MG/2ML IJ SOLN
INTRAMUSCULAR | Status: AC
  Administered 2018-06-19: 01:00:00 4 mg via INTRAVENOUS
  Filled 2018-06-19: qty 2

## 2018-06-19 MED ORDER — MORPHINE SULFATE (PF) 4 MG/ML IV SOLN
4.0000 mg | Freq: Once | INTRAVENOUS | Status: AC
  Administered 2018-06-19: 4 mg via INTRAVENOUS

## 2018-06-19 NOTE — ED Provider Notes (Signed)
[facility] Emergency Department Provider Note  ____________________________________________   First MD Initiated Contact with Patient 06/19/18 0143     (approximate)  I have reviewed the triage vital signs and the nursing notes.   HISTORY  Chief Complaint Emesis and Abdominal Pain    HPI Erin PESNELL is a 38 y.o. female who presents for evaluation of acute onset severe nausea and vomiting that is been going on for at least 8 hours.  She says she has a history of this and it seemed to start about the time she started having her menstrual cycle.  It used to be a monthly occurrence but it got better over time and when she had her children.  It took a "6-year break" but it started up again recently.  This is the third time in the most severe time it has happened over the last few months.  She has been told that it is most likely hormonal.  Nothing in particular makes it better or worse but when it happens she cannot tolerate anything by mouth.  She has vomited dozens of times today.  She denies any pain except for some very mild aching in her abdomen which is common for her.  She denies any recent illnesses.  She denies fever/chills, sore throat, chest pain, shortness of breath, cough, and dysuria.  She admits to smoking marijuana but says she does not smoke daily and says that the issues with her episodic vomiting have been going on since long before she smoked marijuana.  She states that in general nothing makes her symptoms better, but sometimes it does feel better to get in a hot shower.     History reviewed. No pertinent past medical history.  Patient Active Problem List   Diagnosis Date Noted  . Hypokalemia 12/17/2010  . Abdominal pain, acute 12/14/2010  . Prolonged severe nausea and vomiting 12/14/2010    Past Surgical History:  Procedure Laterality Date  . TUBAL LIGATION      Prior to Admission medications   Medication Sig Start Date End Date  Taking? Authorizing Provider  cyclobenzaprine (FLEXERIL) 5 MG tablet Take 1 tablet (5 mg total) by mouth 3 (three) times daily as needed for muscle spasms. Patient not taking: Reported on 06/19/2018 11/20/15   Menshew, Charlesetta Ivory, PA-C  dicyclomine (BENTYL) 20 MG tablet Take 1 tablet (20 mg total) by mouth every 6 (six) hours as needed. Patient not taking: Reported on 06/19/2018 07/15/15   Irean Hong, MD  naproxen (EC NAPROSYN) 500 MG EC tablet Take 1 tablet (500 mg total) by mouth 2 (two) times daily with a meal. Patient not taking: Reported on 06/19/2018 11/20/15   Menshew, Charlesetta Ivory, PA-C  ondansetron (ZOFRAN ODT) 4 MG disintegrating tablet Allow 1-2 tablets to dissolve in your mouth every 8 hours as needed for nausea/vomiting 06/19/18   Loleta Rose, MD  potassium chloride SA (K-DUR,KLOR-CON) 20 MEQ tablet Take 1 tablet (20 mEq total) by mouth 2 (two) times daily. 12/17/10 12/17/11  RaischKayleen Memos, MD    Allergies Patient has no known allergies.  No family history on file.  Social History Social History   Tobacco Use  . Smoking status: Current Every Day Smoker    Types: Cigarettes  . Smokeless tobacco: Never Used  Substance Use Topics  . Alcohol use: No  . Drug use: Yes    Types: Marijuana    Review of Systems Constitutional: No fever/chills Eyes: No visual changes. ENT:  No sore throat. Cardiovascular: Denies chest pain. Respiratory: Denies shortness of breath. Gastrointestinal: Severe persistent nausea and vomiting for most of the day today.  No significant abdominal pain. Genitourinary: Negative for dysuria. Musculoskeletal: Negative for neck pain.  Negative for back pain. Integumentary: Negative for rash. Neurological: Negative for headaches, focal weakness or numbness.   ____________________________________________   PHYSICAL EXAM:  VITAL SIGNS: ED Triage Vitals  Enc Vitals Group     BP 06/18/18 2358 (!) 138/96     Pulse Rate 06/18/18 2358 71      Resp 06/18/18 2358 18     Temp --      Temp src --      SpO2 06/18/18 2358 97 %     Weight 06/18/18 2355 86 kg (189 lb 9.5 oz)     Height --      Head Circumference --      Peak Flow --      Pain Score 06/18/18 2355 9     Pain Loc --      Pain Edu? --      Excl. in GC? --     Constitutional: Alert and oriented.  Appears uncomfortable but nontoxic and is not in acute distress although she did vomit while I was in the room interviewing her. Eyes: Conjunctivae are normal.  Head: Atraumatic. Nose: No congestion/rhinnorhea. Mouth/Throat: Mucous membranes are moist. Neck: No stridor.  No meningeal signs.   Cardiovascular: Normal rate, regular rhythm. Good peripheral circulation. Grossly normal heart sounds. Respiratory: Normal respiratory effort.  No retractions. No audible wheezing. Gastrointestinal: Soft and nontender. No distention.  Patient actively vomiting during the H&P. Musculoskeletal: No lower extremity tenderness nor edema. No gross deformities of extremities. Neurologic:  Normal speech and language. No gross focal neurologic deficits are appreciated.  Skin:  Skin is warm, dry and intact. No rash noted.   ____________________________________________   LABS (all labs ordered are listed, but only abnormal results are displayed)  Labs Reviewed  COMPREHENSIVE METABOLIC PANEL - Abnormal; Notable for the following components:      Result Value   Potassium 3.2 (*)    Glucose, Bld 164 (*)    Total Protein 8.3 (*)    Alkaline Phosphatase 37 (*)    All other components within normal limits  URINALYSIS, COMPLETE (UACMP) WITH MICROSCOPIC - Abnormal; Notable for the following components:   Color, Urine YELLOW (*)    APPearance HAZY (*)    pH 9.0 (*)    Glucose, UA 50 (*)    Ketones, ur 20 (*)    Protein, ur >=300 (*)    All other components within normal limits  URINE DRUG SCREEN, QUALITATIVE ([facility] ONLY) - Abnormal; Notable for the following components:   Opiate, Ur Screen  POSITIVE (*)    Cannabinoid 50 Ng, Ur [place] POSITIVE (*)    All other components within normal limits  LIPASE, BLOOD  CBC  MAGNESIUM  POC URINE PREG, ED  POCT PREGNANCY, URINE  POC URINE PREG, ED   ____________________________________________  EKG  None - EKG not ordered by ED physician ____________________________________________  RADIOLOGY   ED MD interpretation: No indication for imaging  Official radiology report(s): No results found.  ____________________________________________   PROCEDURES   Procedure(s) performed (including Critical Care):  Procedures   ____________________________________________   INITIAL IMPRESSION / MDM / ASSESSMENT AND PLAN / ED COURSE  As part of my medical decision making, I reviewed the following data within the electronic MEDICAL RECORD NUMBER Nursing notes reviewed  and incorporated, Labs reviewed , Old EKG reviewed, Patient signed out to Dr. Williams, Notes from prior ED visits and [place] Controlled Substance Database      *Elonda HMayford Knifeuskyaneba J Lehenbauer was evaluated in Emergency Department on 06/19/2018 for the symptoms described in the history of present illness. She was evaluated in the context of the global COVID-19 pandemic, which necessitated consideration that the patient might be at risk for infection with the SARS-CoV-2 virus that causes COVID-19. Institutional protocols and algorithms that pertain to the evaluation of patients at risk for COVID-19 are in a state of rapid change based on information released by regulatory bodies including the CDC and federal and state organizations. These policies and algorithms were followed during the patient's care in the ED.  Some ED evaluations and interventions may be delayed as a result of limited staffing during the pandemic.*  Differential diagnosis includes, but is not limited to, cyclic vomiting syndrome, cannabinoid hyperemesis syndrome, SBO/ileus, gastroparesis, biliary disease, acute infection otherwise not  specified.  The patient has no contacts or concerns for COVID-19.  Even though she obviously is uncomfortable she has been through this many times in the past.  I reviewed her medical record and I see [facility] since [facility] which she confirmed.  Typically she is mildly hypokalemic (as she is now) and her symptoms are eventually controlled with fluids and antiemetics.  She has had 2 rounds of Zofran 4 mg IV as well as morphine 4 mg IV because initially the concern was that she was having abdominal pain although she is clarified that that seems to not be the case.  She is also received 1 L of normal saline by IV.  Given her persistent symptoms, I am giving her a second liter normal saline IV bolus and injecting haloperidol 5 mg IV into the bolus which will be administered over the course of an hour.  I have also ordered a diphenhydramine 12.5 mg IV to try and avoid side effects of the haloperidol.  Anticipate this will control her cyclic vomiting symptoms.  Her lab results are generally reassuring other than mild hypokalemia of 3.2, her CBC is normal and her lipase is normal as well.  She has not yet provided a urine specimen but she is having no urinary complaints or symptoms.  She is comfortable with the plan.  Clinical Course as of Jun 18 909  Sat Jun 19, 2018  0708 Patient reports that she threw up again not long ago.  I am transferring ED care to Dr. Mayford KnifeWilliams to reassess.   [CF]    Clinical Course User Index [CF] Loleta RoseForbach, Amillion Scobee, MD     ____________________________________________  FINAL CLINICAL IMPRESSION(S) / ED DIAGNOSES  Final diagnoses:  Non-intractable vomiting with nausea, unspecified vomiting type     MEDICATIONS GIVEN DURING THIS VISIT:  Medications  sodium chloride flush (NS) 0.9 % injection 3 mL (3 mLs Intravenous Given 06/19/18 0235)  ondansetron (ZOFRAN) injection 4 mg (4 mg Intravenous Given 06/19/18 0008)  sodium chloride 0.9 % bolus 1,000 mL (0 mLs  Intravenous Stopped 06/19/18 0233)  ondansetron (ZOFRAN) injection 4 mg (4 mg Intravenous Given 06/19/18 0119)  morphine 4 MG/ML injection 4 mg (4 mg Intravenous Given 06/19/18 0118)  sodium chloride 0.9 % bolus 1,000 mL (0 mLs Intravenous Stopped 06/19/18 0333)  haloperidol lactate (HALDOL) injection 5 mg (5 mg Intravenous Given 06/19/18 0239)  diphenhydrAMINE (BENADRYL) injection 12.5 mg (12.5 mg Intravenous Given 06/19/18 0238)     ED  Discharge Orders         Ordered    ondansetron (ZOFRAN ODT) 4 MG disintegrating tablet     06/19/18 5465           Note:  This document was prepared using Dragon voice recognition software and may include unintentional dictation errors.   Loleta Rose, MD 06/19/18 [id number]

## 2018-06-23 ENCOUNTER — Telehealth: Payer: Self-pay | Admitting: Gastroenterology

## 2018-06-23 NOTE — Telephone Encounter (Signed)
I called patient to schedule an ED f/u appointment per Dr Allegra Lai. The appointment can be scheduled with any of the physicians .

## 2018-07-06 ENCOUNTER — Telehealth: Payer: Self-pay | Admitting: Gastroenterology

## 2018-07-06 ENCOUNTER — Encounter: Payer: Self-pay | Admitting: Gastroenterology

## 2018-07-06 NOTE — Telephone Encounter (Signed)
Left vm to schedule a ED f/u with Dr. Marius Ditch letter sent

## 2018-08-11 ENCOUNTER — Ambulatory Visit (LOCAL_COMMUNITY_HEALTH_CENTER): Payer: Self-pay

## 2018-08-11 ENCOUNTER — Other Ambulatory Visit: Payer: Self-pay

## 2018-08-11 DIAGNOSIS — Z113 Encounter for screening for infections with a predominantly sexual mode of transmission: Secondary | ICD-10-CM

## 2018-08-11 NOTE — Progress Notes (Signed)
Test results pending. Provider orders completed.

## 2018-08-11 NOTE — Progress Notes (Signed)
    STI clinic/screening visit  Subjective:  Erin Pitts is a 38 y.o. female being seen today for an STI screening visit. The patient reports they do have symptoms.  Patient has the following medical conditionshas Prolonged severe nausea and vomiting and Hypokalemia on their problem list.  Chief Complaint  Patient presents with  . SEXUALLY TRANSMITTED DISEASE    Patient reports  HPI genital itching x 2-3 days and sm,  Amt. Of clear vag. disch with a fishy odor prior to menses which began today.   See flowsheet for further details and programmatic requirements.    The following portions of the patient's history were reviewed and updated as appropriate: allergies, current medications, past family history, past medical history, past social history, past surgical history and problem list. Problem list updated.  Objective:  There were no vitals filed for this visit.  Physical Exam HENT:     Mouth/Throat:     Pharynx: No oropharyngeal exudate or posterior oropharyngeal erythema.  Neck:     Musculoskeletal: Neck supple. No muscular tenderness.  Abdominal:     Palpations: Abdomen is soft.     Tenderness: There is no abdominal tenderness.  Genitourinary:    General: Normal vulva.     Rectum: Normal.     Comments: Moderate amt of bleeding ( on menses) Lymphadenopathy:     Cervical: No cervical adenopathy.  Skin:    General: Skin is warm and dry.     Findings: No lesion or rash.  Neurological:     Mental Status: She is alert.       Assessment and Plan:  Erin Pitts is a 38 y.o. female presenting to the [facility] Department for STI screening  1. Screening examination for venereal disease Unable to do wet prep d/t bleeding.  Client will return after menses for wet prep if sympts continue. - HIV [place] [facility] - Syphilis Serology, [place] [facility] - Chlamydia/Gonorrhea [place] [facility] - Gonococcus culture     No follow-ups on file.  No future  appointments.  Hassell Done, FNP

## 2018-08-16 ENCOUNTER — Ambulatory Visit: Payer: Self-pay

## 2018-08-16 LAB — GONOCOCCUS CULTURE

## 2019-08-24 ENCOUNTER — Encounter: Payer: Self-pay | Admitting: Advanced Practice Midwife

## 2019-08-24 ENCOUNTER — Ambulatory Visit: Payer: Self-pay

## 2019-08-24 ENCOUNTER — Ambulatory Visit (LOCAL_COMMUNITY_HEALTH_CENTER): Payer: Self-pay

## 2019-08-24 ENCOUNTER — Ambulatory Visit: Payer: Self-pay | Admitting: Advanced Practice Midwife

## 2019-08-24 ENCOUNTER — Other Ambulatory Visit: Payer: Self-pay

## 2019-08-24 DIAGNOSIS — Z9851 Tubal ligation status: Secondary | ICD-10-CM

## 2019-08-24 DIAGNOSIS — F17200 Nicotine dependence, unspecified, uncomplicated: Secondary | ICD-10-CM | POA: Insufficient documentation

## 2019-08-24 DIAGNOSIS — Z23 Encounter for immunization: Secondary | ICD-10-CM

## 2019-08-24 DIAGNOSIS — Z113 Encounter for screening for infections with a predominantly sexual mode of transmission: Secondary | ICD-10-CM

## 2019-08-24 LAB — WET PREP FOR TRICH, YEAST, CLUE
Trichomonas Exam: NEGATIVE
Yeast Exam: NEGATIVE

## 2019-08-24 MED ORDER — METRONIDAZOLE 500 MG PO TABS: 500.0000 mg | ORAL_TABLET | Freq: Two times a day (BID) | ORAL | 0 refills | Status: AC

## 2019-08-24 NOTE — Patient Instructions (Signed)
Tdap adm. R. Delt; see STI flow Sharlette Dense, RN

## 2019-08-24 NOTE — Addendum Note (Signed)
Addended by: Sharlette Dense on: 08/24/2019 02:22 PM   Modules accepted: Orders

## 2019-08-24 NOTE — Progress Notes (Signed)
In for screening due to ?BV s/s; desires HIV/RPR; discussed Tdap today-considering Sharlette Dense, RN

## 2019-08-24 NOTE — Progress Notes (Signed)
Wet prep reviewed-+BV treated per standing order Sharlette Dense, RN

## 2019-08-24 NOTE — Progress Notes (Signed)
[facility] Department STI clinic/screening visit  Subjective:  Erin Pitts is a 39 y.o.DBF G4P2 smoker female being seen today for an STI screening visit. The patient reports they do have symptoms.  Patient reports that they do not desire a pregnancy in the next year.   They reported they are not interested in discussing contraception today.  Patient's last menstrual period was 08/17/2019.   Patient has the following medical conditions:   Patient Active Problem List   Diagnosis Date Noted  . Morbid obesity (HCC) 189 lbs 08/24/2019  . H/O tubal ligation 2014 08/24/2019    Chief Complaint  Patient presents with  . SEXUALLY TRANSMITTED DISEASE    HPI  Patient reports last sex 07/02/19 without condom.  LMP 08/17/19.  BTL 2014.  Smoking 2 cpd.  Last MJ 2 wks ago. Last ETOH 3 days ago (6 shots vodka) 2x/wk.  C/o malodor x 1 month  Last HIV test per patient/review of record was 08/11/18 Patient reports last pap was unknown  See flowsheet for further details and programmatic requirements.    The following portions of the patient's history were reviewed and updated as appropriate: allergies, current medications, past medical history, past social history, past surgical history and problem list.  Objective:  There were no vitals filed for this visit.  Physical Exam Vitals and nursing note reviewed.  Constitutional:      Appearance: Normal appearance. She is obese.  HENT:     Head: Normocephalic and atraumatic.     Mouth/Throat:     Mouth: Mucous membranes are moist.     Pharynx: Oropharynx is clear. No oropharyngeal exudate or posterior oropharyngeal erythema.  Eyes:     Conjunctiva/sclera: Conjunctivae normal.  Pulmonary:     Effort: Pulmonary effort is normal.  Abdominal:     Palpations: Abdomen is soft. There is no mass.     Tenderness: There is no abdominal tenderness. There is no rebound.     Comments: Poor tone, soft without tenderness  Genitourinary:     General: Normal vulva.     Exam position: Lithotomy position.     Pubic Area: No rash or pubic lice.      Labia:        Right: No rash or lesion.        Left: No rash or lesion.      Vagina: Vaginal discharge (creamy white leukorrhea, ph<4.5) present. No erythema, bleeding or lesions.     Cervix: Normal.     Uterus: Normal.      Adnexa: Right adnexa normal and left adnexa normal.     Rectum: Normal.  Lymphadenopathy:     Head:     Right side of head: No preauricular or posterior auricular adenopathy.     Left side of head: No preauricular or posterior auricular adenopathy.     Cervical: No cervical adenopathy.     Upper Body:     Right upper body: No supraclavicular or axillary adenopathy.     Left upper body: No supraclavicular or axillary adenopathy.     Lower Body: No right inguinal adenopathy. No left inguinal adenopathy.  Skin:    General: Skin is warm and dry.     Findings: No rash.  Neurological:     Mental Status: She is alert and oriented to person, place, and time.      Assessment and Plan:  Erin Pitts is a 39 y.o. female presenting to the [facility] Department for STI  screening  1. Morbid obesity (HCC) 189 lbs   2. Screening examination for venereal disease Treat wet mount per standing orders Immunization nurse consult - Syphilis Serology, [place] [facility] - HIV/HCV [place] [facility] - HBV Antigen/Antibody [place] [facility] - Chlamydia/Gonorrhea [place] [facility] - Gonococcus culture - WET PREP FOR TRICH, YEAST, CLUE  3. H/O tubal ligation 2014      Return if symptoms worsen or fail to improve.  No future appointments.  Alberteen Spindle, CNM

## 2019-08-29 LAB — GONOCOCCUS CULTURE

## 2019-08-30 LAB — HM HEPATITIS C SCREENING LAB: HM Hepatitis Screen: NEGATIVE

## 2019-08-30 LAB — HM HIV SCREENING LAB: HM HIV Screening: NEGATIVE

## 2019-08-31 ENCOUNTER — Encounter: Payer: Self-pay | Admitting: Family Medicine

## 2019-08-31 LAB — HEPATITIS B SURFACE ANTIGEN: Hepatitis B Surface Ag: NONREACTIVE

## 2019-09-02 ENCOUNTER — Encounter: Payer: Self-pay | Admitting: Family Medicine

## 2019-09-02 ENCOUNTER — Telehealth: Payer: Self-pay | Admitting: Family Medicine

## 2019-09-02 NOTE — Telephone Encounter (Signed)
Call to patient to discuss TR.  Patient HBV antibody resulted in a "grey zone".  Per [place] [facility].  This test is not showing positive or negative for immunity for HBV.  Patient was vaccinated in 1996 for HBV and more that likely immunity is weaning.  LMOM for patient to return call. Wendi Snipes, RN

## 2019-09-29 ENCOUNTER — Other Ambulatory Visit: Payer: Self-pay

## 2019-09-29 ENCOUNTER — Ambulatory Visit: Payer: Self-pay | Admitting: Advanced Practice Midwife

## 2019-09-29 ENCOUNTER — Encounter: Payer: Self-pay | Admitting: Advanced Practice Midwife

## 2019-09-29 DIAGNOSIS — Z113 Encounter for screening for infections with a predominantly sexual mode of transmission: Secondary | ICD-10-CM

## 2019-09-29 LAB — WET PREP FOR TRICH, YEAST, CLUE
Trichomonas Exam: NEGATIVE
Yeast Exam: NEGATIVE

## 2019-09-29 MED ORDER — METRONIDAZOLE 500 MG PO TABS: 500.0000 mg | ORAL_TABLET | Freq: Two times a day (BID) | ORAL | 0 refills | Status: AC

## 2019-09-29 NOTE — Progress Notes (Signed)
Wet Mount results reviewed by provider E. Sciora, CNM. Patient treated for BV per provider orders. Shaun Runyon, RN  

## 2019-09-29 NOTE — Progress Notes (Signed)
[facility] Department STI clinic/screening visit  Subjective:  Erin Pitts is a 39 y.o.SBF G4P2 smoker  female being seen today for an STI screening visit. The patient reports they do have symptoms.  Patient reports that they do not desire a pregnancy in the next year.   They reported they are not interested in discussing contraception today.  Patient's last menstrual period was 09/13/2019 (exact date).   Patient has the following medical conditions:   Patient Active Problem List   Diagnosis Date Noted  . Morbid obesity (HCC) 189 lbs 08/24/2019  . H/O tubal ligation 2014 08/24/2019  . Smoker 2 cpd 08/24/2019    No chief complaint on file.   HPI  Patient reports LMP 09/13/19.  Last sex 09/21/19 without condom first time with this partner.  Last ETOH 09/02/19 (5 shots vodka) 2-3x/mo.  Last MJ 06/2019.  C/o malodor and bump on labia  Last HIV test per patient/review of record was 08/30/19 Patient reports last pap was in [place]? wnl  See flowsheet for further details and programmatic requirements.    The following portions of the patient's history were reviewed and updated as appropriate: allergies, current medications, past medical history, past social history, past surgical history and problem list.  Objective:  There were no vitals filed for this visit.  Physical Exam Vitals and nursing note reviewed.  Constitutional:      Appearance: Normal appearance. She is obese.  HENT:     Head: Normocephalic and atraumatic.     Mouth/Throat:     Mouth: Mucous membranes are moist.     Pharynx: Oropharynx is clear. No oropharyngeal exudate or posterior oropharyngeal erythema.  Eyes:     Conjunctiva/sclera: Conjunctivae normal.  Pulmonary:     Effort: Pulmonary effort is normal.  Abdominal:     Palpations: Abdomen is soft. There is no mass.     Tenderness: There is no abdominal tenderness. There is no rebound.     Comments: Soft without tenderness, poor tone, increased adipose   Genitourinary:    General: Normal vulva.     Exam position: Lithotomy position.     Pubic Area: No rash or pubic lice.      Labia:        Right: No rash or lesion.        Left: No rash or lesion.      Vagina: Vaginal discharge (thick white leukorrhea, ph inconclusive) present. No erythema, bleeding or lesions.     Cervix: Normal.     Uterus: Normal.      Adnexa: Right adnexa normal and left adnexa normal.     Rectum: Normal.  Lymphadenopathy:     Head:     Right side of head: No preauricular or posterior auricular adenopathy.     Left side of head: No preauricular or posterior auricular adenopathy.     Cervical: No cervical adenopathy.     Upper Body:     Right upper body: No supraclavicular or axillary adenopathy.     Left upper body: No supraclavicular or axillary adenopathy.     Lower Body: No right inguinal adenopathy. No left inguinal adenopathy.  Skin:    General: Skin is warm and dry.     Findings: No rash.  Neurological:     Mental Status: She is alert and oriented to person, place, and time.      Assessment and Plan:  Erin Pitts is a 39 y.o. female presenting to the [facility]  Department for STI screening  1. Screening examination for venereal disease Treat wet mount per standing orders Immunization nurse consult Counseled via 5 A's to stop smoking Please give primary care MD list Wendi Snipes, RN recommends repeating Hep B today.  Last titer in "grey zone" and inconclusive.  Pt vaccinated in 1996 - WET PREP FOR TRICH, YEAST, CLUE - Chlamydia/Gonorrhea [place] [facility]     No follow-ups on file.  No future appointments.  Alberteen Spindle, CNM

## 2019-09-29 NOTE — Progress Notes (Addendum)
Here today for STD screening. Accepts bloodwork. Per Elveria Rising, RN patient needs HBV redrawn due to indeterminite result in April. Tawny Hopping, RN

## 2019-10-04 LAB — GONOCOCCUS CULTURE

## 2019-10-06 ENCOUNTER — Encounter: Payer: Self-pay | Admitting: Family Medicine

## 2020-05-25 ENCOUNTER — Ambulatory Visit: Payer: Medicaid Other | Admitting: Advanced Practice Midwife

## 2020-05-28 ENCOUNTER — Ambulatory Visit: Payer: Medicaid Other

## 2020-09-13 ENCOUNTER — Encounter: Payer: Self-pay | Admitting: Family Medicine

## 2020-09-13 ENCOUNTER — Other Ambulatory Visit: Payer: Self-pay

## 2020-09-13 ENCOUNTER — Ambulatory Visit: Payer: Self-pay | Admitting: Family Medicine

## 2020-09-13 ENCOUNTER — Ambulatory Visit: Payer: Medicaid Other

## 2020-09-13 DIAGNOSIS — Z113 Encounter for screening for infections with a predominantly sexual mode of transmission: Secondary | ICD-10-CM

## 2020-09-13 LAB — HM HIV SCREENING LAB: HM HIV Screening: NEGATIVE

## 2020-09-13 NOTE — Progress Notes (Signed)
Pt here for STD screening.  Wet mount results reviewed, no treatment required.  Aliz Meritt M Sonny Poth, RN ? ?

## 2020-09-13 NOTE — Progress Notes (Signed)
[facility] Department STI clinic/screening visit  Subjective:  Erin Pitts is a 40 y.o. female being seen today for an STI screening visit. The patient reports they do have symptoms.  Patient reports that they do not desire a pregnancy in the next year.   They reported they are not interested in discussing contraception today.  Patient's last menstrual period was 09/05/2020.   Patient has the following medical conditions:   Patient Active Problem List   Diagnosis Date Noted   Morbid obesity (HCC) 189 lbs 08/24/2019   H/O tubal ligation 2014 08/24/2019   Smoker 2 cpd 08/24/2019    Chief Complaint  Patient presents with   SEXUALLY TRANSMITTED DISEASE    Screening     HPI  Patient reports here for screening, reports s/sx   Last HIV test per patient/review of record was 08/30/2019 Patient reports last pap was a feqw years go .   See flowsheet for further details and programmatic requirements.    The following portions of the patient's history were reviewed and updated as appropriate: allergies, current medications, past medical history, past social history, past surgical history and problem list.  Objective:  There were no vitals filed for this visit.  Physical Exam Vitals and nursing note reviewed.  Constitutional:      Appearance: Normal appearance. She is obese.  HENT:     Head: Normocephalic and atraumatic.     Mouth/Throat:     Mouth: Mucous membranes are moist.     Pharynx: Oropharynx is clear. No oropharyngeal exudate or posterior oropharyngeal erythema.  Pulmonary:     Effort: Pulmonary effort is normal.  Abdominal:     General: Abdomen is flat.     Palpations: There is no mass.     Tenderness: There is no abdominal tenderness. There is no rebound.  Genitourinary:    General: Normal vulva.     Exam position: Lithotomy position.     Pubic Area: No rash or pubic lice.      Labia:        Right: No rash or lesion.        Left: No rash or lesion.       Vagina: Normal. No vaginal discharge, erythema, bleeding or lesions.     Cervix: No cervical motion tenderness, discharge, friability, lesion or erythema.     Uterus: Normal.      Adnexa: Right adnexa normal and left adnexa normal.     Rectum: Normal.     Comments: External genitalia without, lice, nits, erythema, edema or inguinal adenopathy. Vagina with normal mucosa and discharge and pH equals 4.  Cervix without visual lesions, uterus firm, mobile, non-tender, no masses, CMT adnexal fullness or tenderness.   2 ,<105mm healed lesion in perineal area  Musculoskeletal:     Cervical back: Normal range of motion and neck supple.  Lymphadenopathy:     Head:     Right side of head: No preauricular or posterior auricular adenopathy.     Left side of head: No preauricular or posterior auricular adenopathy.     Cervical: No cervical adenopathy.     Upper Body:     Right upper body: No supraclavicular or axillary adenopathy.     Left upper body: No supraclavicular or axillary adenopathy.     Lower Body: No right inguinal adenopathy. No left inguinal adenopathy.  Skin:    General: Skin is warm and dry.     Findings: No rash.  Neurological:  Mental Status: She is alert and oriented to person, place, and time.  Psychiatric:        Mood and Affect: Mood normal.        Behavior: Behavior normal.     Assessment and Plan:  Erin Pitts is a 40 y.o. female presenting to the [facility] Department for STI screening  1. Screening examination for venereal disease  - Chlamydia/Gonorrhea [place] [facility] - HIV [place] [facility] - Syphilis Serology, [place] [facility] - WET PREP FOR TRICH, YEAST, CLUE - Chlamydia/Gonorrhea [place] [facility] - Chlamydia/Gonorrhea  Lab - Virology, [facility]   Patient accepted all screenings including wet prep, oral, vaginal and anal CT/GC and bloodwork for HIV/RPR.  Patient meets criteria for HepB screening? No. Ordered? No - does not meet criteria   Patient meets criteria for HepC screening? No. Ordered? No - dos not meet criteria   Wet prep results neg    Treatment needed  Discussed time line for State Lab results and that patient will be called with positive results and encouraged patient to call if she had not heard in 2 weeks.  Counseled to return or seek care for continued or worsening symptoms Recommended condom use with all sex  Patient is currently using Postpartum Sterilization to prevent pregnancy.   No follow-ups on file.  No future appointments.  Wendi Snipes, FNP

## 2020-09-14 LAB — WET PREP FOR TRICH, YEAST, CLUE
Trichomonas Exam: NEGATIVE
Yeast Exam: NEGATIVE

## 2020-10-26 ENCOUNTER — Other Ambulatory Visit: Payer: Medicaid Other

## 2020-12-15 ENCOUNTER — Other Ambulatory Visit: Payer: Self-pay

## 2020-12-15 ENCOUNTER — Emergency Department: Payer: Medicaid Other

## 2020-12-15 ENCOUNTER — Emergency Department
Admission: EM | Admit: 2020-12-15 | Discharge: 2020-12-15 | Disposition: A | Discharge status: Home / Self Care | Payer: Medicaid Other | Attending: Emergency Medicine | Admitting: Emergency Medicine

## 2020-12-15 DIAGNOSIS — W172XXA Fall into hole, initial encounter: Secondary | ICD-10-CM | POA: Insufficient documentation

## 2020-12-15 DIAGNOSIS — S99912A Unspecified injury of left ankle, initial encounter: Secondary | ICD-10-CM | POA: Diagnosis present

## 2020-12-15 DIAGNOSIS — S93402A Sprain of unspecified ligament of left ankle, initial encounter: Secondary | ICD-10-CM | POA: Diagnosis not present

## 2020-12-15 DIAGNOSIS — Y92197 Garden or yard of other specified residential institution as the place of occurrence of the external cause: Secondary | ICD-10-CM | POA: Insufficient documentation

## 2020-12-15 DIAGNOSIS — F17210 Nicotine dependence, cigarettes, uncomplicated: Secondary | ICD-10-CM | POA: Diagnosis not present

## 2020-12-15 DIAGNOSIS — W19XXXA Unspecified fall, initial encounter: Secondary | ICD-10-CM

## 2020-12-15 MED ORDER — MELOXICAM 15 MG PO TABS: 15.0000 mg | ORAL_TABLET | Freq: Every day | ORAL | 2 refills | Status: AC

## 2020-12-15 MED ORDER — TRAMADOL HCL 50 MG PO TABS: 50.0000 mg | ORAL_TABLET | Freq: Four times a day (QID) | ORAL | 0 refills | Status: DC | PRN

## 2020-12-15 NOTE — ED Provider Notes (Signed)
[facility] Emergency Department Provider Note  ____________________________________________   Event Date/Time   First MD Initiated Contact with Patient 12/15/20 1607     (approximate)  I have reviewed the triage vital signs and the nursing notes.   HISTORY  Chief Complaint Ankle Pain    HPI Erin Pitts is a 40 y.o. female presents emergency department complaining of left ankle pain.  Patient was dropping her children off at a birthday party today when she stepped in a hole in the yard.  Patient states she felt a pop in the ankle, no numbness or tingling  Past Medical History:  Diagnosis Date   Hypokalemia 12/17/2010    Patient Active Problem List   Diagnosis Date Noted   Morbid obesity (HCC) 189 lbs 08/24/2019   H/O tubal ligation 2014 08/24/2019   Smoker 2 cpd 08/24/2019    Past Surgical History:  Procedure Laterality Date   TUBAL LIGATION      Prior to Admission medications   Medication Sig Start Date End Date Taking? Authorizing Provider  meloxicam (MOBIC) 15 MG tablet Take 1 tablet (15 mg total) by mouth daily. 12/15/20 12/15/21 Yes Jacere Pangborn, Roselyn Bering, PA-C  traMADol (ULTRAM) 50 MG tablet Take 1 tablet (50 mg total) by mouth every 6 (six) hours as needed. 12/15/20  Yes Faythe Ghee, PA-C    Allergies Patient has no known allergies.  No family history on file.  Social History Social History   Tobacco Use   Smoking status: Every Day    Types: Cigarettes   Smokeless tobacco: Never  Vaping Use   Vaping Use: Never used  Substance Use Topics   Alcohol use: Yes    Alcohol/week: 4.0 standard drinks    Types: 4 Glasses of wine per week   Drug use: Not Currently    Types: Marijuana    Comment: last used 06/2020    Review of Systems  Constitutional: No fever/chills Eyes: No visual changes. ENT: No sore throat. Respiratory: Denies cough Cardiovascular: Denies chest pain Gastrointestinal: Denies abdominal  pain Genitourinary: Negative for dysuria. Musculoskeletal: Negative for back pain.  Positive left ankle pain Skin: Negative for rash. Psychiatric: no mood changes,     ____________________________________________   PHYSICAL EXAM:  VITAL SIGNS: ED Triage Vitals  Enc Vitals Group     BP 12/15/20 1600 125/82     Pulse Rate 12/15/20 1600 82     Resp 12/15/20 1600 20     Temp 12/15/20 1600 98.6 F (37 C)     Temp Source 12/15/20 1600 Oral     SpO2 12/15/20 1600 100 %     Weight 12/15/20 1600 255 lb (115.7 kg)     Height 12/15/20 1600 5\' 11"  (1.803 m)     Head Circumference --      Peak Flow --      Pain Score 12/15/20 1602 8     Pain Loc --      Pain Edu? --      Excl. in GC? --     Constitutional: Alert and oriented. Well appearing and in no acute distress. Eyes: Conjunctivae are normal.  Head: Atraumatic. Nose: No congestion/rhinnorhea. Mouth/Throat: Mucous membranes are moist.   Neck:  supple no lymphadenopathy noted Cardiovascular: Normal rate, regular rhythm.  Respiratory: Normal respiratory effort.  No retractions,  GU: deferred Musculoskeletal: FROM all extremities, warm and well perfused, left ankle is grossly swollen and tender along the lateral malleolus, neurovascular is intact Neurologic:  Normal  speech and language.  Skin:  Skin is warm, dry and intact. No rash noted. Psychiatric: Mood and affect are normal. Speech and behavior are normal.  ____________________________________________   LABS (all labs ordered are listed, but only abnormal results are displayed)  Labs Reviewed - No data to display ____________________________________________   ____________________________________________  RADIOLOGY  X-ray of the left ankle  ____________________________________________   PROCEDURES  Procedure(s) performed: Cam walker and crutches applied by nursing staff   Procedures    ____________________________________________   INITIAL IMPRESSION  / ASSESSMENT AND PLAN / ED COURSE  Pertinent labs & imaging results that were available during my care of the patient were reviewed by me and considered in my medical decision making (see chart for details).   The patient is a 40 year old female presents emergency department left ankle pain after an injury today.  See HPI.  Physical exam shows patient be stable.  X-ray of the left ankle reviewed by me confirmed by radiology to be negative for fracture, radiologist does comment on the amount of soft tissue swelling.  I did explain the findings to the patient.  She is placed in a cam walker, given crutches, given prescription for meloxicam and tramadol.  She is to elevate and ice.  Follow-up with orthopedics if not improving in 1 week.  Return if worsening.  Discharged in stable condition.     Erin Pitts was evaluated in Emergency Department on 12/15/2020 for the symptoms described in the history of present illness. She was evaluated in the context of the global COVID-19 pandemic, which necessitated consideration that the patient might be at risk for infection with the SARS-CoV-2 virus that causes COVID-19. Institutional protocols and algorithms that pertain to the evaluation of patients at risk for COVID-19 are in a state of rapid change based on information released by regulatory bodies including the CDC and federal and state organizations. These policies and algorithms were followed during the patient's care in the ED.    As part of my medical decision making, I reviewed the following data within the electronic MEDICAL RECORD NUMBER Nursing notes reviewed and incorporated, Old chart reviewed, Radiograph reviewed , Notes from prior ED visits, and [place] Controlled Substance Database  ____________________________________________   FINAL CLINICAL IMPRESSION(S) / ED DIAGNOSES  Final diagnoses:  Fall  Sprain of left ankle, unspecified ligament, initial encounter      NEW MEDICATIONS STARTED DURING  THIS VISIT:  New Prescriptions   MELOXICAM (MOBIC) 15 MG TABLET    Take 1 tablet (15 mg total) by mouth daily.   TRAMADOL (ULTRAM) 50 MG TABLET    Take 1 tablet (50 mg total) by mouth every 6 (six) hours as needed.     Note:  This document was prepared using Dragon voice recognition software and may include unintentional dictation errors.    Faythe Ghee, PA-C 12/15/20 1716    Sharyn Creamer, MD 12/15/20 [phone]

## 2020-12-15 NOTE — Discharge Instructions (Signed)
Follow-up with orthopedics if not improving in 1 week.  Return if worsening.  Elevate and ice the ankle. Take the meloxicam daily, can also take Tylenol, tramadol for pain not controlled by the previous 2 medicines

## 2020-12-15 NOTE — ED Triage Notes (Signed)
Patient c/o report mechanical fall at 1445. Patient c/o left ankle pain post fall.

## 2021-04-05 ENCOUNTER — Ambulatory Visit: Payer: Medicaid Other

## 2021-04-08 ENCOUNTER — Ambulatory Visit: Payer: Medicaid Other

## 2021-04-15 ENCOUNTER — Encounter: Payer: Self-pay | Admitting: Family Medicine

## 2021-04-15 ENCOUNTER — Ambulatory Visit: Payer: Medicaid Other | Admitting: Family Medicine

## 2021-04-15 ENCOUNTER — Other Ambulatory Visit: Payer: Self-pay

## 2021-04-15 DIAGNOSIS — Z113 Encounter for screening for infections with a predominantly sexual mode of transmission: Secondary | ICD-10-CM

## 2021-04-15 LAB — HM HIV SCREENING LAB: HM HIV Screening: NEGATIVE

## 2021-04-15 NOTE — Progress Notes (Signed)
Pt here for STD screening.  Wet mount results reviewed, no treatment required per SO.  Pt declined condoms.  Erin Pitts M Dajanay Northrup, RN ° °

## 2021-04-15 NOTE — Progress Notes (Signed)
[facility] Department ? ?STI clinic/screening visit ?[address] ?[place] [address] ?[phone] ? ?Subjective:  ?Erin Pitts is a 41 y.o. female being seen today for an STI screening visit. The patient reports they do not have symptoms.  Patient reports that they do not desire a pregnancy in the next year.   They reported they are not interested in discussing contraception today.   ? ?Patient's last menstrual period was 04/05/2021 (exact date). ? ? ?Patient has the following medical conditions:   ?Patient Active Problem List  ? Diagnosis Date Noted  ? Morbid obesity (HCC) 189 lbs 08/24/2019  ? H/O tubal ligation 2014 08/24/2019  ? Smoker 2 cpd 08/24/2019  ? ? ?Chief Complaint  ?Patient presents with  ? SEXUALLY TRANSMITTED DISEASE  ?  STD screening  ? ? ?HPI ? ?Patient reports here for screening, denies s/sx  ? ?Last HIV test per patient/review of record was 09/13/2020 ?Patient reports last pap "a while ago".  ? ?Screening for MPX risk: ?Does the patient have an unexplained rash? No ?Is the patient MSM? No ?Does the patient endorse multiple sex partners or anonymous sex partners? No ?Did the patient have close or sexual contact with a person diagnosed with MPX? No ?Has the patient traveled outside the [place] where MPX is endemic? No ?Is there a high clinical suspicion for MPX-- evidenced by one of the following No ? -Unlikely to be chickenpox ? -Lymphadenopathy ? -Rash that present in same phase of evolution on any given body part ?See flowsheet for further details and programmatic requirements.  ? ? ?The following portions of the patient's history were reviewed and updated as appropriate: allergies, current medications, past medical history, past social history, past surgical history and problem list. ? ?Objective:  ?There were no vitals filed for this visit. ? ?Physical Exam ?Vitals and nursing note reviewed.  ?Constitutional:   ?   Appearance: Normal appearance. She is obese.  ?HENT:   ?   Head: Normocephalic and atraumatic.  ?   Mouth/Throat:  ?   Mouth: Mucous membranes are moist.  ?   Pharynx: Oropharynx is clear. No oropharyngeal exudate or posterior oropharyngeal erythema.  ?Pulmonary:  ?   Effort: Pulmonary effort is normal.  ?Abdominal:  ?   General: Abdomen is flat.  ?   Palpations: There is no mass.  ?   Tenderness: There is no abdominal tenderness. There is no rebound.  ?Genitourinary: ?   General: Normal vulva.  ?   Exam position: Lithotomy position.  ?   Pubic Area: No rash or pubic lice.   ?   Labia:     ?   Right: No rash or lesion.     ?   Left: No rash or lesion.   ?   Vagina: Normal. No vaginal discharge, erythema, bleeding or lesions.  ?   Cervix: No cervical motion tenderness, discharge, friability, lesion or erythema.  ?   Uterus: Normal.   ?   Adnexa: Right adnexa normal and left adnexa normal.  ?   Rectum: Normal.  ?   Comments: External genitalia without, lice, nits, erythema, edema , lesions or inguinal adenopathy. Vagina with normal mucosa and discharge and pH equals 4.  Cervix without visual lesions, uterus firm, mobile, non-tender, no masses, CMT adnexal fullness or tenderness.  ? ?Hemorrhoids present  ?Musculoskeletal:  ?   Cervical back: Normal range of motion and neck supple.  ?Lymphadenopathy:  ?   Head:  ?  Right side of head: No preauricular or posterior auricular adenopathy.  ?   Left side of head: No preauricular or posterior auricular adenopathy.  ?   Cervical: No cervical adenopathy.  ?   Upper Body:  ?   Right upper body: No supraclavicular or axillary adenopathy.  ?   Left upper body: No supraclavicular or axillary adenopathy.  ?   Lower Body: No right inguinal adenopathy. No left inguinal adenopathy.  ?Skin: ?   General: Skin is warm and dry.  ?   Findings: No rash.  ?Neurological:  ?   Mental Status: She is alert and oriented to person, place, and time.  ?Psychiatric:     ?   Mood and Affect: Mood normal.     ?   Behavior: Behavior normal.   ? ? ? ?Assessment and Plan:  ?Erin Pitts is a 41 y.o. female presenting to the [facility] Department for STI screening ? ?1. Screening examination for venereal disease ?Patient accepted all screenings including wet prep, oral, vaginal, rectal CT/GC and bloodwork for HIV/RPR.  ?Patient meets criteria for HepB screening? No. Ordered? No - no longer has behavior  ?Patient meets criteria for HepC screening? No. Ordered? No - no longer has behavior  ? ?Wet prep results neg    ?Treatment needed  ?Discussed time line for State Lab results and that patient will be called with positive results and encouraged patient to call if she had not heard in 2 weeks.  ?Counseled to return or seek care for continued or worsening symptoms ?Recommended condom use with all sex ? ?Patient is currently using  Tubal ligation  to prevent pregnancy.   ?- Chlamydia/Gonorrhea [place] [facility] ?- HIV [place] [facility] ?- Syphilis Serology, [place] [facility] ?- WET PREP FOR TRICH, YEAST, CLUE ?- Chlamydia/Gonorrhea [place] [facility] ?- Chlamydia/Gonorrhea  Lab ? ?No follow-ups on file. ? ?No future appointments. ? ?Wendi Snipes, FNP ?

## 2021-04-16 LAB — WET PREP FOR TRICH, YEAST, CLUE
Trichomonas Exam: NEGATIVE
Yeast Exam: NEGATIVE

## 2021-07-26 ENCOUNTER — Encounter: Payer: Self-pay | Admitting: Obstetrics

## 2021-10-07 ENCOUNTER — Ambulatory Visit: Payer: Medicaid Other | Admitting: Advanced Practice Midwife

## 2021-10-07 ENCOUNTER — Encounter: Payer: Self-pay | Admitting: Advanced Practice Midwife

## 2021-10-07 DIAGNOSIS — Z113 Encounter for screening for infections with a predominantly sexual mode of transmission: Secondary | ICD-10-CM | POA: Diagnosis not present

## 2021-10-07 LAB — WET PREP FOR TRICH, YEAST, CLUE
Trichomonas Exam: NEGATIVE
Yeast Exam: NEGATIVE

## 2021-10-07 NOTE — Progress Notes (Signed)
Wet prep reviewed - negative results. Lorita Forinash, RN  

## 2021-10-07 NOTE — Progress Notes (Signed)
[facility] Department  STI clinic/screening visit [address] [place] [address] [phone]  Subjective:  Erin Pitts is a 41 y.o. SBF smoker G4P2  female being seen today for an STI screening visit. The patient reports they do have symptoms.  Patient reports that they do not desire a pregnancy in the next year.   They reported they are not interested in discussing contraception today.    Patient's last menstrual period was 09/25/2021 (within days).   Patient has the following medical conditions:   Patient Active Problem List   Diagnosis Date Noted   Morbid obesity (HCC) 255 lbs 08/24/2019   H/O tubal ligation 2014 08/24/2019   Smoker 2 cpd 08/24/2019    Chief Complaint  Patient presents with   SEXUALLY TRANSMITTED DISEASE    HPI  Patient reports c/o urinary incontinence with sweating and raw, sensitive area on perineum x 3 wks. LMP 09/26/21. Last sex 10/03/21 without condom; with current partner x 2 mo; 1 partner in last 3 mo. Smoking 2-5 cpd. Last MJ 06/2020. Last ETOH last night (5 shots vodka) 2x/wk. BTL 2014.  Last HIV test per patient/review of record was 04/15/21 Patient reports last pap was can't remember when  Screening for MPX risk: Does the patient have an unexplained rash? No Is the patient MSM? No Does the patient endorse multiple sex partners or anonymous sex partners? No Did the patient have close or sexual contact with a person diagnosed with MPX? No Has the patient traveled outside the [place] where MPX is endemic? No Is there a high clinical suspicion for MPX-- evidenced by one of the following No  -Unlikely to be chickenpox  -Lymphadenopathy  -Rash that present in same phase of evolution on any given body part See flowsheet for further details and programmatic requirements.   Immunization history:  Immunization History  Administered Date(s) Administered   Hepatitis B 12/04/1993, 01/01/1994, 06/18/1994   Tdap 08/24/2019      The following portions of the patient's history were reviewed and updated as appropriate: allergies, current medications, past medical history, past social history, past surgical history and problem list.  Objective:  There were no vitals filed for this visit.  Physical Exam Vitals and nursing note reviewed.  Constitutional:      Appearance: Normal appearance. She is obese.  HENT:     Head: Normocephalic and atraumatic.     Mouth/Throat:     Mouth: Mucous membranes are moist.     Pharynx: Oropharynx is clear. No oropharyngeal exudate or posterior oropharyngeal erythema.  Eyes:     Conjunctiva/sclera: Conjunctivae normal.  Pulmonary:     Effort: Pulmonary effort is normal.  Abdominal:     Palpations: Abdomen is soft. There is no mass.     Tenderness: There is no abdominal tenderness. There is no rebound.     Comments: Soft without masses or tenderness; oincresaed adipose, poor tone  Genitourinary:    General: Normal vulva.     Exam position: Lithotomy position.     Pubic Area: No rash or pubic lice.      Labia:        Right: No rash or lesion.        Left: No rash or lesion.      Vagina: Vaginal discharge (thick white leukorrhea,ph>4.5) present. No erythema, bleeding or lesions.     Cervix: Normal.     Uterus: Normal.      Rectum: Normal.     Comments: pH = >  4.5 Lymphadenopathy:     Head:     Right side of head: No preauricular or posterior auricular adenopathy.     Left side of head: No preauricular or posterior auricular adenopathy.     Cervical: No cervical adenopathy.     Right cervical: No superficial, deep or posterior cervical adenopathy.    Left cervical: No superficial, deep or posterior cervical adenopathy.     Upper Body:     Right upper body: No supraclavicular, axillary or epitrochlear adenopathy.     Left upper body: No supraclavicular, axillary or epitrochlear adenopathy.     Lower Body: No right inguinal adenopathy. No left inguinal adenopathy.  Skin:     General: Skin is warm and dry.     Findings: No rash.  Neurological:     Mental Status: She is alert and oriented to person, place, and time.      Assessment and Plan:  DIAHN WAIDELICH is a 41 y.o. female presenting to the [facility] Department for STI screening  1. Screening examination for venereal disease Treat wet mount per standing orders Immunization nurse consult  - WET PREP FOR TRICH, YEAST, CLUE - Chlamydia/Gonorrhea [place] [facility] - Gonococcus culture - Gonococcus culture     No follow-ups on file.  No future appointments.  Alberteen Spindle, CNM

## 2021-10-11 LAB — GONOCOCCUS CULTURE

## 2022-02-07 ENCOUNTER — Ambulatory Visit: Payer: Medicaid Other

## 2022-02-17 ENCOUNTER — Ambulatory Visit: Payer: Medicaid Other | Admitting: Family

## 2022-02-17 ENCOUNTER — Encounter: Payer: Self-pay | Admitting: Family

## 2022-02-17 DIAGNOSIS — Z113 Encounter for screening for infections with a predominantly sexual mode of transmission: Secondary | ICD-10-CM | POA: Diagnosis not present

## 2022-02-17 LAB — WET PREP FOR TRICH, YEAST, CLUE
Trichomonas Exam: NEGATIVE
Yeast Exam: NEGATIVE

## 2022-02-17 LAB — HM HIV SCREENING LAB: HM HIV Screening: NEGATIVE

## 2022-02-17 NOTE — Progress Notes (Signed)
Pt is here for STD screening.  Wet mount results reviewed, no treatment required per SO.  Condoms given.  Brahm Barbeau M Demarie Uhlig, RN  

## 2022-02-21 ENCOUNTER — Ambulatory Visit: Payer: Medicaid Other

## 2022-02-21 NOTE — Progress Notes (Signed)
[facility] Department  STI clinic/screening visit [place] [address] [phone]  Subjective:  Erin Pitts is a 42 y.o. female being seen today for an STI screening visit. The patient reports they do not have symptoms.  Patient reports that they do not desire a pregnancy in the next year.   They reported they are not interested in discussing contraception today.    Patient's last menstrual period was 02/06/2022 (exact date).  Patient has the following medical conditions:   Patient Active Problem List   Diagnosis Date Noted   Morbid obesity ([place]) 255 lbs 08/24/2019   H/O tubal ligation 2014 08/24/2019   Smoker 2-5 cpd 08/24/2019    Chief Complaint  Patient presents with   [place]    Partner has another partner    HPI  Patient reports that her partner was recently seen out with another female so she desires STI testing. She has been monogamous and now knows that he has not.  Denies STI symptoms at this time.  Does the patient using douching products? No  Last HIV test per patient/review of record was  Lab Results  Component Value Date   HMHIVSCREEN Negative - Validated 04/15/2021    Lab Results  Component Value Date   HIV NON REACTIVE 12/13/2010   Patient reports last pap was No results found for: "DIAGPAP"   Screening for MPX risk: Does the patient have an unexplained rash? No Is the patient MSM? No Does the patient endorse multiple sex partners or anonymous sex partners? No Did the patient have close or sexual contact with a person diagnosed with MPX? No Has the patient traveled outside the [place] where MPX is endemic? No Is there a high clinical suspicion for MPX-- evidenced by one of the following No  -Unlikely to be chickenpox  -Lymphadenopathy  -Rash that present in same phase of evolution on any given body part See flowsheet for further details and programmatic requirements.   Immunization history:   Immunization History  Administered Date(s) Administered   Hepatitis B 12/04/1993, 01/01/1994, 06/18/1994   Tdap 08/24/2019     The following portions of the patient's history were reviewed and updated as appropriate: allergies, current medications, past medical history, past social history, past surgical history and problem list.  Objective:  There were no vitals filed for this visit.  Physical Exam Nursing note reviewed.  Constitutional:      Appearance: Normal appearance.  HENT:     Mouth/Throat:     Mouth: Mucous membranes are moist.     Pharynx: Oropharynx is clear. No oropharyngeal exudate.  Abdominal:     Palpations: Abdomen is soft.     Tenderness: There is no abdominal tenderness.  Genitourinary:    General: Normal vulva.     Exam position: Lithotomy position.     Pubic Area: No rash.      Labia:        Right: No rash, tenderness or lesion.        Left: No rash, tenderness or lesion.      Rectum: Normal.  Lymphadenopathy:     Cervical: No cervical adenopathy.     Lower Body: No right inguinal adenopathy. No left inguinal adenopathy.  Skin:    General: Skin is warm and dry.     Findings: No rash.  Neurological:     Mental Status: She is alert and oriented to person, place, and time.  Psychiatric:  Mood and Affect: Mood normal.        Behavior: Behavior normal.      Assessment and Plan:  Erin Pitts is a 42 y.o. female presenting to the [facility] Department for STI screening  1. Screening examination for venereal disease Will contact if positive results Use condoms for all sex  - HIV [place] [facility] - WET PREP FOR [place], YEAST, CLUE - Chlamydia/Gonorrhea [place] [facility] - Syphilis Serology, [place] [facility] - Gonococcus culture   Patient accepted all screenings including oral, vaginal CT/GC and bloodwork for HIV/RPR, and wet prep. Patient meets criteria for HepB screening? Yes. Ordered? no Patient meets criteria for HepC screening? Yes.  Ordered? no  Treat wet prep per standing order Discussed time line for State Lab results and that patient will be called with positive results and encouraged patient to call if she had not heard in 2 weeks.  Counseled to return or seek care for continued or worsening symptoms Recommended condom use with all sex  Patient is currently using condoms to prevent pregnancy.    Return if symptoms worsen or fail to improve.  No future appointments.  Marline Backbone, FNP

## 2022-02-22 LAB — GONOCOCCUS CULTURE

## 2022-05-14 ENCOUNTER — Ambulatory Visit: Payer: Medicaid Other | Admitting: Family Medicine

## 2022-05-14 ENCOUNTER — Encounter: Payer: Self-pay | Admitting: Family Medicine

## 2022-05-14 DIAGNOSIS — Z113 Encounter for screening for infections with a predominantly sexual mode of transmission: Secondary | ICD-10-CM

## 2022-05-14 DIAGNOSIS — B9689 Other specified bacterial agents as the cause of diseases classified elsewhere: Secondary | ICD-10-CM

## 2022-05-14 LAB — HM HIV SCREENING LAB: HM HIV Screening: NEGATIVE

## 2022-05-14 LAB — WET PREP FOR TRICH, YEAST, CLUE
Trichomonas Exam: NEGATIVE
Yeast Exam: NEGATIVE

## 2022-05-14 MED ORDER — METRONIDAZOLE 500 MG PO TABS: 500.0000 mg | ORAL_TABLET | Freq: Two times a day (BID) | ORAL | 0 refills | Status: DC

## 2022-05-14 NOTE — Addendum Note (Signed)
Addended by: Jerel Shepherd on: 05/14/2022 02:27 PM   Modules accepted: Orders

## 2022-05-14 NOTE — Progress Notes (Signed)
Pt here for STD screening. Wet mount results reviewed.   The patient was dispensed Metronidazole 500 mg #14 today per order Aliene Altes, FNP. I provided counseling today regarding the medication. We discussed the medication, the side effects and when to call clinic. Patient given the opportunity to ask questions. Questions answered.   BV pamphlet and metronidazole info sheet given.  Condoms declined. Jerel Shepherd, RN

## 2022-05-14 NOTE — Progress Notes (Signed)
[facility] Department  STI clinic/screening visit [address] [place] [address] [phone]  Subjective:  Erin Pitts is a 42 y.o. female being seen today for an STI screening visit. The patient reports they do have symptoms.  Patient reports that they do not desire a pregnancy in the next year.   They reported they are not interested in discussing contraception today.    Patient's last menstrual period was 04/24/2022 (exact date).  Patient has the following medical conditions:   Patient Active Problem List   Diagnosis Date Noted   Morbid obesity (HCC) 255 lbs 08/24/2019   H/O tubal ligation 2014 08/24/2019   Smoker 2-5 cpd 08/24/2019    Chief Complaint  Patient presents with   SEXUALLY TRANSMITTED DISEASE    Screening.  Vaginal lesion x 4 days    HPI  Patient reports to clinic for STI testing  Does the patient using douching products? No  Last HIV test per patient/review of record was  Lab Results  Component Value Date   HMHIVSCREEN Negative - Validated 02/17/2022    Lab Results  Component Value Date   HIV NON REACTIVE 12/13/2010   Patient reports last pap was No results found for: "DIAGPAP" No results found for: "SPECADGYN"  Screening for MPX risk: Does the patient have an unexplained rash? No Is the patient MSM? No Does the patient endorse multiple sex partners or anonymous sex partners? No Did the patient have close or sexual contact with a person diagnosed with MPX? No Has the patient traveled outside the [place] where MPX is endemic? No Is there a high clinical suspicion for MPX-- evidenced by one of the following No  -Unlikely to be chickenpox  -Lymphadenopathy  -Rash that present in same phase of evolution on any given body part See flowsheet for further details and programmatic requirements.   Immunization history:  Immunization History  Administered Date(s) Administered   Hepatitis B 12/04/1993, 01/01/1994, 06/18/1994    Tdap 08/24/2019     The following portions of the patient's history were reviewed and updated as appropriate: allergies, current medications, past medical history, past social history, past surgical history and problem list.  Objective:  There were no vitals filed for this visit.  Physical Exam Vitals and nursing note reviewed.  Constitutional:      Appearance: Normal appearance.  HENT:     Head: Normocephalic and atraumatic.     Mouth/Throat:     Mouth: Mucous membranes are moist.     Pharynx: Oropharynx is clear. No oropharyngeal exudate or posterior oropharyngeal erythema.  Pulmonary:     Effort: Pulmonary effort is normal.  Abdominal:     General: Abdomen is flat.     Palpations: There is no mass.     Tenderness: There is no abdominal tenderness. There is no rebound.  Genitourinary:    Exam position: Lithotomy position.     Pubic Area: No rash or pubic lice.      Labia:        Right: No rash or lesion.        Left: No rash or lesion.      Vagina: Normal. No vaginal discharge, erythema, bleeding or lesions.     Cervix: No cervical motion tenderness, discharge, friability, lesion or erythema.     Uterus: Normal.      Adnexa: Right adnexa normal and left adnexa normal.     Rectum: Normal.       Comments: pH = 5  Small round lesion with white head on left labia- tender to touch- appears to be ingrown hair Lymphadenopathy:     Head:     Right side of head: No preauricular or posterior auricular adenopathy.     Left side of head: No preauricular or posterior auricular adenopathy.     Cervical: No cervical adenopathy.     Upper Body:     Right upper body: No supraclavicular, axillary or epitrochlear adenopathy.     Left upper body: No supraclavicular, axillary or epitrochlear adenopathy.     Lower Body: No right inguinal adenopathy. No left inguinal adenopathy.  Skin:    General: Skin is warm and dry.     Findings: No rash.  Neurological:     Mental Status: She is  alert and oriented to person, place, and time.     Assessment and Plan:  EYANNA MCGONAGLE is a 42 y.o. female presenting to the [facility] Department for STI screening  1. Screening for venereal disease  - WET PREP FOR TRICH, YEAST, CLUE - Gonococcus culture - HIV [place] [facility] - Syphilis Serology, [place] [facility] - Chlamydia/Gonorrhea [place] [facility]   Patient accepted all screenings including oral, vaginal CT/GC and bloodwork for HIV/RPR, and wet prep. Patient meets criteria for HepB screening? No. Ordered? not applicable Patient meets criteria for HepC screening? No. Ordered? not applicable  Treat wet prep per standing order Discussed time line for State Lab results and that patient will be called with positive results and encouraged patient to call if she had not heard in 2 weeks.  Counseled to return or seek care for continued or worsening symptoms Recommended repeat testing in 3 months with positive results. Recommended condom use with all sex  Patient is currently using Sterilization by Laparoscopy to prevent pregnancy.    Return if symptoms worsen or fail to improve, for STI screening.  Future Appointments  Date Time Provider Department Center  05/22/2022 10:05 AM [facility]-STI PROVIDER [facility] None  Total time spent 20 minutes  Lenice Llamas, [place]

## 2022-05-19 LAB — GONOCOCCUS CULTURE

## 2022-05-22 ENCOUNTER — Ambulatory Visit: Payer: Medicaid Other

## 2022-10-07 ENCOUNTER — Other Ambulatory Visit: Payer: Self-pay

## 2022-10-07 DIAGNOSIS — Z1231 Encounter for screening mammogram for malignant neoplasm of breast: Secondary | ICD-10-CM

## 2023-07-07 ENCOUNTER — Ambulatory Visit

## 2023-07-07 ENCOUNTER — Encounter: Payer: Self-pay | Admitting: Nurse Practitioner

## 2023-07-07 DIAGNOSIS — Z113 Encounter for screening for infections with a predominantly sexual mode of transmission: Secondary | ICD-10-CM

## 2023-07-07 LAB — HM HEPATITIS C SCREENING LAB: HM Hepatitis Screen: NEGATIVE

## 2023-07-07 LAB — HM HIV SCREENING LAB: HM HIV Screening: NEGATIVE

## 2023-07-07 NOTE — Progress Notes (Signed)
 Pt here for STI screening.  Wet mount results reviewed with patient.  No treatment needed at this time.  Condoms declined.-Collins Scotland, RN

## 2023-07-08 LAB — WET PREP FOR TRICH, YEAST, CLUE
Clue Cell Exam: NEGATIVE
Trichomonas Exam: NEGATIVE
Yeast Exam: NEGATIVE

## 2023-07-08 LAB — HBV ANTIGEN/ANTIBODY STATE LAB
Hep B Core Total Ab: NONREACTIVE
Hep B S Ab: NONREACTIVE
Hepatitis B Surface Antigen: NONREACTIVE

## 2023-07-12 LAB — GONOCOCCUS CULTURE

## 2023-07-19 NOTE — Progress Notes (Signed)
 [facility] Department STI clinic [address], [address] [place] [address] Main phone: [phone]  STI screening visit  Subjective:  Erin Pitts is a 43 y.o. female being seen today for an STI screening visit. The patient reports they do have symptoms.  Patient reports that they do not desire a pregnancy in the next year.   They reported they are not interested in discussing contraception today.    Patient's last menstrual period was 06/22/2023 (approximate).  Patient has the following medical conditions:  Patient Active Problem List   Diagnosis Date Noted   Morbid obesity (HCC) 255 lbs 08/24/2019   H/O tubal ligation 2014 08/24/2019   Smoker 2-5 cpd 08/24/2019   Chief Complaint  Patient presents with   SEXUALLY TRANSMITTED DISEASE    HPI Patient is a pleasant 43 y.o. female who presents to the office today requesting symptomaticSTI testing.  Patient reports symptoms include frequent urination, a feeling of burning, pressure and pulling in her abdomen, back pain, and pain with sex that began 2 days prior. She denies blood in her urine and nausea/vomiting.   She does report having similar symptoms in 04/2022 and being diagnosed with UTI.   Patient indicates 1 female partner in the last 2 months. She reports practicing vaginal, oral, and anal sex and never uses condoms. Patient indicates an STI history of chlamydia, gonorrhea, and trichomoniasis. Patient reports last sex was 3 days ago. She indicates female sterilization as contraceptive method. Patient indicates LMP was 06/22/23.    See flowsheet for further details and programmatic requirements Hyperlink available at the top of the signed note in blue.  Flow sheet content below:  Pregnancy Intention Screening Does the patient want to become pregnant in the next year?: No Does the patient's partner want to become pregnant in the next year?: No Would the patient like to discuss contraceptive  options today?: No All Patients Anyone smoke around pt and/or pt's children?: No Anyone smoke inside pt's house?: No Anyone smoke inside car?: No Anyone smoke inside the workplace?: No Reason For STD Screen STD Screening: Has symptoms Have you ever had an STD?: Yes History of Antibiotic use in the past 2 weeks?: No STD Symptoms Denies all: No Genital Itching: No Lower abdominal pain: Yes Abd pain s/s: Pulling and burning in abdomen Discharge: No Dysuria: Yes Dysuria s/s: Started 2 days ago Genital ulcer / lesion: No Rash: No Vaginal irritation: No Oral / Other skin ulcer: No Pain with sex: Yes Pain with sex s/s: Started with urinary symptoms Sore Throat: No Visual Changes: No Vaginal Bleeding: No Other (Describe in Comments): No Risk Factors for Hep B Household, sexual, or needle sharing contact of a person infected with Hep B: No Sexual contact with a person who uses drugs not as prescribed?: No Currently or Ever used drugs not as prescribed: No HIV Positive: No PRep Patient: No Men who have sex with men: N/A Have Hepatitis C: No History of Incarceration: No History of Homeslessness?: No Anal sex following anal drug use?: No Risk Factors for Hep C Currently using drugs not as prescribed: No Sexual partner(s) currently using drugs as not prescribed: No History of drug use: Yes HIV Positive: No People with a history of incarceration: No People born between the years of [birth year] and 1965: No Hepatitis Counseling Hep C Counseling: Counseled patient about increased risk of Hep C and recommendation for testing, Patient accepts testing for Hep C today Abuse History Has patient ever been abused  physically?: No Has patient ever been abused sexually?: No Does patient feel they have a problem with Anxiety?: Yes Does patient feel they have a problem with Depression?: No Referral to Behavioral Health: Declined Counseling Patient counseled to use condoms with all sex: Condoms  declined RTC in 2-3 weeks for test results: Yes Clinic will call if test results abnormal before test result appt.: Yes Immunizations: Referred Test results given to patient Patient counseled to use condoms with all sex: Condoms declined Contraception Wrap Up Current Method: Female Sterilization End Method: Female Sterilization Contraception Counseling Provided: No How was the end contraceptive method provided?: N/A   Screening for MPX risk: Does the patient have an unexplained rash? No Is the patient MSM? No Does the patient endorse multiple sex partners or anonymous sex partners? No Did the patient have close or sexual contact with a person diagnosed with MPX? No Has the patient traveled outside the [place]  where MPX is endemic? No Is there a high clinical suspicion for MPX-- evidenced by one of the following No  -Unlikely to be chickenpox  -Lymphadenopathy  -Rash that present in same phase of evolution on any given body part  Screenings: Last HIV test per patient/review of record was  Lab Results  Component Value Date   HMHIVSCREEN Negative - Validated 05/14/2022    Lab Results  Component Value Date   HIV NON REACTIVE 12/13/2010     Last HEPC test per patient/review of record was  Lab Results  Component Value Date   HMHEPCSCREEN Negative-Validated 08/30/2019   No components found for: HEPC   Last HEPB test per patient/review of record was No components found for: HMHEPBSCREEN   Patient reports last pap was:   No results found for: SPECADGYN No Cervical Cancer Screening results to display.  Immunization history:  Immunization History  Administered Date(s) Administered   Hepatitis B 12/04/1993, 01/01/1994, 06/18/1994   Tdap 08/24/2019    The following portions of the patient's history were reviewed and updated as appropriate: allergies, current medications, past medical history, past social history, past surgical history and problem list.  Objective:  There  were no vitals filed for this visit.  Physical Exam Nursing note reviewed. Exam conducted with a chaperone present Brett Orange, CNA).  Constitutional:      Appearance: Normal appearance.  HENT:     Head: Normocephalic.     Salivary Glands: Right salivary gland is not diffusely enlarged or tender. Left salivary gland is not diffusely enlarged or tender.     Mouth/Throat:     Lips: Pink. No lesions.     Mouth: Mucous membranes are moist.     Tongue: No lesions. Tongue does not deviate from midline.     Pharynx: Oropharynx is clear. Uvula midline.     Tonsils: No tonsillar exudate.   Eyes:     General:        Right eye: No discharge.        Left eye: No discharge.   Pulmonary:     Effort: Pulmonary effort is normal.  Genitourinary:    General: Normal vulva.     Exam position: Lithotomy position.     Pubic Area: No rash or pubic lice.      Tanner stage (genital): 5.     Labia:        Right: No rash, tenderness, lesion or injury.        Left: No rash, tenderness, lesion or injury.      Vagina: Vaginal discharge present.  No erythema, tenderness, bleeding or lesions.     Cervix: Normal. No cervical motion tenderness, discharge, friability, lesion, erythema, cervical bleeding or eversion.     Uterus: Normal.      Adnexa: Right adnexa normal and left adnexa normal.     Comments: pH<4.5 Minimal amount of white, adherent, malodorous vaginal discharge present within vaginal canal. Lymphadenopathy:     Head:     Right side of head: No submental, submandibular, tonsillar, preauricular or posterior auricular adenopathy.     Left side of head: No submental, submandibular, tonsillar, preauricular or posterior auricular adenopathy.     Cervical: No cervical adenopathy.     Right cervical: No superficial or posterior cervical adenopathy.    Left cervical: No superficial or posterior cervical adenopathy.     Upper Body:     Right upper body: No supraclavicular or axillary adenopathy.      Left upper body: No supraclavicular or axillary adenopathy.     Lower Body: No right inguinal adenopathy. No left inguinal adenopathy.   Skin:    General: Skin is warm and dry.     Findings: No lesion or rash.     Comments: Skin tone appropriate for ethnicity.    Neurological:     Mental Status: She is alert and oriented to person, place, and time.   Psychiatric:        Attention and Perception: Attention and perception normal.        Mood and Affect: Mood and affect normal.        Speech: Speech normal.        Behavior: Behavior normal. Behavior is cooperative.        Thought Content: Thought content normal.     Assessment and Plan:  MORGAINE KIMBALL is a 43 y.o. female presenting to the [facility] Department for STI screening  1. Screening for venereal disease (Primary) Wet prep negative in office today.  Advised patient her symptoms are consistent with UTI, but unable to test in this clinic. Advised patient seek further evaluation at PCP, UC, or ED.  - Chlamydia/Gonorrhea [place] [facility] - WET PREP FOR TRICH, YEAST, CLUE - Syphilis Serology, [place] [facility] - HBV Antigen/Antibody State Lab - HIV/HCV [place] [facility] - Gonococcus culture - Gonococcus culture   Patient accepted the following screenings: anal GC culture, oral GC culture, vaginal CT/GC swab, vaginal wet prep, HIV, RPR, Hep B, and Hep C Patient meets criteria for HepB screening? Yes. Ordered? yes Patient meets criteria for HepC screening? Yes. Ordered? yes  Treat wet prep per standing order Discussed time line for State Lab results and that patient will be called with positive results and encouraged patient to call if she had not heard in 2 weeks.  Counseled to return or seek care for continued or worsening symptoms Recommended repeat testing in 3 months with positive results. Recommended condom use with all sex for STI prevention.   Patient is currently using Sterilization for Men and Women to prevent  pregnancy.    Return if symptoms worsen or fail to improve.  No future appointments.  The level of service of this patient encounter was coded based on the total time (30 minutes) spent both face-to-face and non-face-to-face, including pre-visit, intra-visit, and post-visit activities.    Clarita LITTIE Narrow, NP

## 2023-07-21 ENCOUNTER — Encounter: Payer: Self-pay | Admitting: Nurse Practitioner

## 2023-09-29 ENCOUNTER — Other Ambulatory Visit: Payer: Self-pay | Admitting: Medical Genetics

## 2023-10-14 ENCOUNTER — Other Ambulatory Visit: Payer: Self-pay

## 2023-10-14 DIAGNOSIS — Z1231 Encounter for screening mammogram for malignant neoplasm of breast: Secondary | ICD-10-CM

## 2023-12-28 DEATH — deceased
